# Patient Record
Sex: Female | Born: 1975 | Race: White | Hispanic: No | Marital: Single | Smoking: Former smoker
Health system: Southern US, Community
[De-identification: ages and names within clinical notes are randomized; demographics above are authoritative.]

## PROBLEM LIST (undated history)

## (undated) DIAGNOSIS — G8929 Other chronic pain: Secondary | ICD-10-CM

## (undated) DIAGNOSIS — M5136 Other intervertebral disc degeneration, lumbar region: Secondary | ICD-10-CM

## (undated) DIAGNOSIS — F319 Bipolar disorder, unspecified: Secondary | ICD-10-CM

## (undated) DIAGNOSIS — M549 Dorsalgia, unspecified: Secondary | ICD-10-CM

## (undated) DIAGNOSIS — M797 Fibromyalgia: Secondary | ICD-10-CM

## (undated) DIAGNOSIS — F603 Borderline personality disorder: Secondary | ICD-10-CM

## (undated) DIAGNOSIS — F410 Panic disorder [episodic paroxysmal anxiety] without agoraphobia: Secondary | ICD-10-CM

## (undated) DIAGNOSIS — R45851 Suicidal ideations: Secondary | ICD-10-CM

## (undated) DIAGNOSIS — R569 Unspecified convulsions: Secondary | ICD-10-CM

## (undated) DIAGNOSIS — M51369 Other intervertebral disc degeneration, lumbar region without mention of lumbar back pain or lower extremity pain: Secondary | ICD-10-CM

## (undated) DIAGNOSIS — M25519 Pain in unspecified shoulder: Secondary | ICD-10-CM

## (undated) HISTORY — DX: Bipolar disorder, unspecified: F31.9

## (undated) HISTORY — DX: Borderline personality disorder: F60.3

## (undated) HISTORY — PX: HERNIA REPAIR: SHX51

---

## 1997-07-19 ENCOUNTER — Encounter (HOSPITAL_COMMUNITY): Admission: RE | Admit: 1997-07-19 | Discharge: 1997-08-30 | Payer: Self-pay | Admitting: Obstetrics

## 1999-09-28 ENCOUNTER — Encounter: Admission: RE | Admit: 1999-09-28 | Discharge: 1999-09-28 | Payer: Self-pay | Admitting: Obstetrics

## 2001-07-03 ENCOUNTER — Encounter: Payer: Self-pay | Admitting: Internal Medicine

## 2001-07-03 ENCOUNTER — Ambulatory Visit (HOSPITAL_COMMUNITY): Admission: RE | Admit: 2001-07-03 | Discharge: 2001-07-03 | Payer: Self-pay | Admitting: Internal Medicine

## 2001-09-25 ENCOUNTER — Encounter: Payer: Self-pay | Admitting: *Deleted

## 2001-09-25 ENCOUNTER — Emergency Department (HOSPITAL_COMMUNITY): Admission: EM | Admit: 2001-09-25 | Discharge: 2001-09-25 | Payer: Self-pay | Admitting: *Deleted

## 2001-10-10 ENCOUNTER — Inpatient Hospital Stay (HOSPITAL_COMMUNITY): Admission: EM | Admit: 2001-10-10 | Discharge: 2001-10-11 | Payer: Self-pay | Admitting: *Deleted

## 2002-01-17 ENCOUNTER — Emergency Department (HOSPITAL_COMMUNITY): Admission: EM | Admit: 2002-01-17 | Discharge: 2002-01-17 | Payer: Self-pay | Admitting: Emergency Medicine

## 2002-01-17 ENCOUNTER — Encounter: Payer: Self-pay | Admitting: Emergency Medicine

## 2003-03-13 ENCOUNTER — Emergency Department (HOSPITAL_COMMUNITY): Admission: EM | Admit: 2003-03-13 | Discharge: 2003-03-13 | Payer: Self-pay | Admitting: Emergency Medicine

## 2003-09-25 ENCOUNTER — Ambulatory Visit (HOSPITAL_COMMUNITY): Admission: RE | Admit: 2003-09-25 | Discharge: 2003-09-25 | Payer: Self-pay | Admitting: Neurology

## 2003-09-27 ENCOUNTER — Emergency Department (HOSPITAL_COMMUNITY): Admission: EM | Admit: 2003-09-27 | Discharge: 2003-09-27 | Payer: Self-pay | Admitting: *Deleted

## 2003-12-01 ENCOUNTER — Ambulatory Visit (HOSPITAL_COMMUNITY): Admission: RE | Admit: 2003-12-01 | Discharge: 2003-12-01 | Payer: Self-pay | Admitting: *Deleted

## 2003-12-28 ENCOUNTER — Emergency Department (HOSPITAL_COMMUNITY): Admission: EM | Admit: 2003-12-28 | Discharge: 2003-12-28 | Payer: Self-pay | Admitting: Emergency Medicine

## 2004-03-25 ENCOUNTER — Emergency Department (HOSPITAL_COMMUNITY): Admission: EM | Admit: 2004-03-25 | Discharge: 2004-03-25 | Payer: Self-pay | Admitting: Emergency Medicine

## 2004-06-24 ENCOUNTER — Emergency Department (HOSPITAL_COMMUNITY): Admission: EM | Admit: 2004-06-24 | Discharge: 2004-06-24 | Payer: Self-pay | Admitting: Emergency Medicine

## 2005-08-06 ENCOUNTER — Emergency Department (HOSPITAL_COMMUNITY): Admission: EM | Admit: 2005-08-06 | Discharge: 2005-08-06 | Payer: Self-pay | Admitting: Emergency Medicine

## 2005-09-29 ENCOUNTER — Emergency Department (HOSPITAL_COMMUNITY): Admission: EM | Admit: 2005-09-29 | Discharge: 2005-09-29 | Payer: Self-pay | Admitting: Emergency Medicine

## 2005-10-31 ENCOUNTER — Emergency Department (HOSPITAL_COMMUNITY): Admission: EM | Admit: 2005-10-31 | Discharge: 2005-10-31 | Payer: Self-pay | Admitting: Emergency Medicine

## 2007-09-28 ENCOUNTER — Inpatient Hospital Stay (HOSPITAL_COMMUNITY): Admission: EM | Admit: 2007-09-28 | Discharge: 2007-09-29 | Payer: Self-pay | Admitting: Emergency Medicine

## 2008-02-19 ENCOUNTER — Emergency Department (HOSPITAL_COMMUNITY): Admission: EM | Admit: 2008-02-19 | Discharge: 2008-02-19 | Payer: Self-pay | Admitting: Emergency Medicine

## 2008-03-29 ENCOUNTER — Ambulatory Visit (HOSPITAL_COMMUNITY): Admission: RE | Admit: 2008-03-29 | Discharge: 2008-03-29 | Payer: Self-pay | Admitting: Family Medicine

## 2008-12-21 ENCOUNTER — Ambulatory Visit (HOSPITAL_COMMUNITY): Admission: RE | Admit: 2008-12-21 | Discharge: 2008-12-21 | Payer: Self-pay | Admitting: Family Medicine

## 2009-10-28 ENCOUNTER — Emergency Department (HOSPITAL_COMMUNITY): Admission: EM | Admit: 2009-10-28 | Discharge: 2009-10-29 | Payer: Self-pay | Admitting: Emergency Medicine

## 2009-12-24 ENCOUNTER — Emergency Department (HOSPITAL_COMMUNITY): Admission: EM | Admit: 2009-12-24 | Discharge: 2009-12-24 | Payer: Self-pay | Admitting: Emergency Medicine

## 2010-09-03 LAB — CULTURE, ROUTINE-ABSCESS

## 2010-09-05 LAB — URINALYSIS, ROUTINE W REFLEX MICROSCOPIC
Bilirubin Urine: NEGATIVE
Glucose, UA: NEGATIVE mg/dL
Hgb urine dipstick: NEGATIVE
Protein, ur: NEGATIVE mg/dL

## 2010-09-05 LAB — GLUCOSE, CAPILLARY: Glucose-Capillary: 101 mg/dL — ABNORMAL HIGH (ref 70–99)

## 2010-10-04 ENCOUNTER — Other Ambulatory Visit (HOSPITAL_COMMUNITY): Payer: Self-pay | Admitting: Family Medicine

## 2010-10-04 DIAGNOSIS — N63 Unspecified lump in unspecified breast: Secondary | ICD-10-CM

## 2010-10-11 ENCOUNTER — Ambulatory Visit (HOSPITAL_COMMUNITY): Payer: Medicaid Other
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
## 2010-11-03 NOTE — H&P (Signed)
NAME:  [name], [name]                             ACCOUNT NO.:  [id number]   MEDICAL RECORD NO.:  [id number]                  PATIENT TYPE:   LOCATION:                                       FACILITY:   PHYSICIAN:  Langley Gauss, M.D.                DATE OF BIRTH:   DATE OF ADMISSION:  DATE OF DISCHARGE:                                HISTORY & PHYSICAL   ANTICIPATED DATE OF ADMISSION:  Not yet determined.   The patient is a 35 year old, gravida 2, para 1, with an unreliable last  menstrual period who comes in the office today on November 25, 2003 for a planned  repeat ultrasound.  The patient is noted to be an insulin-dependent  diabetic.  She has had some pelvic cramping, but no complaints of any  vaginal bleeding.  She has good breast tenderness.  She has had nausea.  Her  blood sugars are somewhat improved, in the range of 200 to 250.  She is  currently planning on getting an insulin pump.  She does well managing her  own blood sugars at home, making daily adjustments.  Repeat transvaginal  ultrasound performed today as compared to previous ultrasound dated November 19, 2003 shows there is a large gestational sac identified within the uterus  with a maximum diameter of 1.67 cm seated within a well defined delineated  yolk sac with a maximum diameter of 0.87 cm.  However, a fetal pole is not  well seen.  What appears to be a fetal pole is measured at five weeks and  six days.  However, no fetal cardiac activity is seen in this portion of the  scan.  Pulsations are seen on the uterine wall.  It is uncertain whether  these represent artifact or very early signs of cardiac activity, which are  not measurable by the fetal Doppler and the ultrasound machine.   I discussed the findings with the patient.  In the absence of any  significant vaginal bleeding or cramping and continued change in the  ultrasound there is no indication for any intervention at present.  However,  clinically this appears  to be a very slowly developing pregnancy and thus  fetal cardiac activity should be seen very shortly if this is to be  considered a normal pregnancy.     ___________________________________________                                         Langley Gauss, M.D.   DC/MEDQ  D:  11/25/2003  T:  11/25/2003  Job:  [id number]

## 2010-11-03 NOTE — Op Note (Signed)
NAME:  [name], [name]                           ACCOUNT NO.:  [id number]   MEDICAL RECORD NO.:  [id number]                   PATIENT TYPE:  AMB   LOCATION:  DAY                                  FACILITY:  [facility]   PHYSICIAN:  Langley Gauss, M.D.                DATE OF BIRTH:  10/27/1975   DATE OF PROCEDURE:  12/01/2003  DATE OF DISCHARGE:                                 OPERATIVE REPORT   PREOPERATIVE DIAGNOSES:  1. Missed abortion, [redacted] weeks gestation.  2. Insulin-dependent diabetes mellitus preceding the pregnancy, onset age 58.   PROCEDURE PERFORMED:  Dilation and curettage, #8 suction catheter tip  utilized.   SURGEON:  Dr. Roylene Reason. Lisette Grinder   ANESTHESIA:  General endotracheal.   SPECIMENS:  Obvious products of conception to pathology for permanent  sections only.   FINDINGS:  An 8 week size uterus, obvious products of conception obtained  for specimen.  Intrauterine cavity noted to be normal in contour.   SUMMARY:  Vital signs stable.  The patient did have apprehensions regarding  proceeding with the planned surgical procedure.  When I discussed again, as  24 hours previously, the sonographic findings with the findings consistent  with missed abortion, the patient was prepared to proceed.  She was taken to  the operating room.  Vital signs stable.  Underwent induction of general  anesthesia, placed in dorsal lithotomy position, prepped and draped in the  sterile manner.  Sterile speculum examination visualized the cervix.  Mucoid  discharge identified, chronic cervicitis or active cervicitis identified.  Sounded to a depth of 8 cm, anteflexed uterus, then dilated to a size #25  dilator which easily allows passage of a #8 suction catheter tip up to the  uterine fundus.  Gentle suction is then applied.  Superior withdrawing  motion resulted in suctioning the entire uterine cavity.  Fine banjo  curettage performed with no further products of conception obtained.  Additional  suction curettage obtained.  No additional tissue obtained.  Minimal bleeding noted to ensue.  Bimanual massage reveals 8 week size  uterus, firm in consistency.  The patient reversed of anesthesia and taken  to the recovery room in stable condition.   Planned day of discharge is December 01, 2003, given standard discharge  instructions, and follow up in the office in 1 week's time.  The patient  will continue monitoring and adjusting her insulin as clinically indicated  as she does on her own at home.   PERTINENT LABORATORY STUDIES:  Hemoglobin 12.0, hematocrit 34.9, white count  6.4.  Fasting glucose on arrival 79.  Type and screen clearly ordered upon  patient's presentation to surgical unit at 10 a.m., now at 1345.  This  result is not in the chart.  She will, of course, receive a RhoGAM work-up  as needed if indicated.  If the patient does well postoperatively, she will  be discharged home  today.  The patient did receive 600 mg of IV Cleocin  preoperatively, as she states she has a PENICILLIN allergy which results in  throat swelling.  She did well with the Cleocin and discharge medications to  include p.o. doxycycline.  The patient has received prescription for Lortab  24 hours previously.      ___________________________________________                                            Langley Gauss, M.D.   DC/MEDQ  D:  12/01/2003  T:  12/01/2003  Job:  [id number]

## 2010-11-03 NOTE — Discharge Summary (Signed)
[place]  Patient:    [name], [name] Visit Number: [id number] MRN: [id number]          Service Type: MED Location: [id number] Attending Physician:  Alfonso Ramus Dictated by:   Harrold Donath, M.D. Admit Date:  10/10/2001 Discharge Date: 10/11/2001   CC:         Carylon Perches, M.D.,    Discharge Summary  DISCHARGE DIAGNOSIS:  Diabetes mellitus, type 1, with hypoglycemia.  DISCHARGE MEDICATIONS: 1. Humalog 12 units after meals. 2. Lantus 18 units subcu q.h.s.  HISTORY OF PRESENT ILLNESS:  Patient is a 35 year old with diabetes mellitus, type 1, who presented with hypoglycemia of 18.  She takes 15 units of Humalog with meals and 25 units of Lantus at bedtime.  She presented feeling tremulous, anxious, and nauseated, and was noted to have a CBG of 18.  HOSPITAL COURSE:  Diabetes mellitus, type 1.  The patient responded well to two amps of D50.  This brought her CBG up to 102.  She was given IV fluids with D5 and was monitored.  She was given diabetic teaching and her doses were changed to help prevent hypoglycemia.  The patient did well overnight and was taking p.o. by the day of discharge without any further nausea.  CONDITION ON DISCHARGE:  Patient was discharged to home in stable condition.  INSTRUCTIONS:  Patient was told of her lower doses and also to call her primary care physician, Dr. Ouida Sills, for an appointment in one to two weeks. Dictated by:   Harrold Donath, M.D. Attending Physician:  Alfonso Ramus DD:  10/31/01 TD:  11/04/01 Job: [id number]

## 2010-12-27 ENCOUNTER — Ambulatory Visit (HOSPITAL_COMMUNITY)
Admission: RE | Admit: 2010-12-27 | Discharge: 2010-12-27 | Disposition: A | Discharge status: Home / Self Care | Payer: Medicaid Other | Source: Ambulatory Visit | Attending: Family Medicine | Admitting: Family Medicine

## 2010-12-27 ENCOUNTER — Other Ambulatory Visit (HOSPITAL_COMMUNITY): Payer: Self-pay | Admitting: Family Medicine

## 2010-12-27 DIAGNOSIS — N63 Unspecified lump in unspecified breast: Secondary | ICD-10-CM

## 2011-01-03 ENCOUNTER — Encounter (HOSPITAL_COMMUNITY): Payer: Medicaid Other

## 2011-03-13 LAB — DIFFERENTIAL
Basophils Absolute: 0
Basophils Relative: 1
Eosinophils Relative: 2
Monocytes Absolute: 0.6
Neutro Abs: 5.5

## 2011-03-13 LAB — BASIC METABOLIC PANEL
CO2: 24
Calcium: 9.3
Glucose, Bld: 372 — ABNORMAL HIGH
Sodium: 135

## 2011-03-13 LAB — RAPID URINE DRUG SCREEN, HOSP PERFORMED
Amphetamines: NOT DETECTED
Barbiturates: NOT DETECTED
Benzodiazepines: POSITIVE — AB
Cocaine: NOT DETECTED
Opiates: POSITIVE — AB

## 2011-03-13 LAB — CBC
Hemoglobin: 13.6
MCHC: 35.1
RDW: 13

## 2011-03-13 LAB — GLUCOSE, RANDOM: Glucose, Bld: 393 — ABNORMAL HIGH

## 2011-03-21 LAB — POCT I-STAT, CHEM 8
BUN: 8
Chloride: 103
HCT: 40
Sodium: 140
TCO2: 27

## 2011-03-21 LAB — CBC
Hemoglobin: 13.3
MCHC: 34.6
MCV: 95.7
RBC: 4.01

## 2011-03-21 LAB — DIFFERENTIAL
Basophils Relative: 1
Eosinophils Absolute: 0.4
Monocytes Absolute: 0.4
Monocytes Relative: 6

## 2011-03-21 LAB — URINALYSIS, ROUTINE W REFLEX MICROSCOPIC
Bilirubin Urine: NEGATIVE
Glucose, UA: NEGATIVE
Hgb urine dipstick: NEGATIVE
Specific Gravity, Urine: <1.005 — ABNORMAL LOW
pH: 6

## 2011-03-21 LAB — WET PREP, GENITAL: Clue Cells Wet Prep HPF POC: NONE SEEN

## 2011-04-11 ENCOUNTER — Emergency Department (HOSPITAL_COMMUNITY)
Admission: EM | Admit: 2011-04-11 | Discharge: 2011-04-11 | Disposition: A | Discharge status: Other Acute Inpatient Hospital | Payer: Medicaid Other | Source: Home / Self Care | Attending: Emergency Medicine | Admitting: Emergency Medicine

## 2011-04-11 ENCOUNTER — Encounter: Payer: Self-pay | Admitting: Emergency Medicine

## 2011-04-11 ENCOUNTER — Inpatient Hospital Stay (HOSPITAL_COMMUNITY)
Admission: AD | Admit: 2011-04-11 | Discharge: 2011-04-13 | DRG: 885 | Disposition: A | Discharge status: Home / Self Care | Payer: Medicaid Other | Source: Ambulatory Visit | Attending: Psychiatry | Admitting: Psychiatry

## 2011-04-11 DIAGNOSIS — Z79899 Other long term (current) drug therapy: Secondary | ICD-10-CM

## 2011-04-11 DIAGNOSIS — Z880 Allergy status to penicillin: Secondary | ICD-10-CM

## 2011-04-11 DIAGNOSIS — F319 Bipolar disorder, unspecified: Secondary | ICD-10-CM

## 2011-04-11 DIAGNOSIS — F411 Generalized anxiety disorder: Secondary | ICD-10-CM

## 2011-04-11 DIAGNOSIS — Z6379 Other stressful life events affecting family and household: Secondary | ICD-10-CM

## 2011-04-11 DIAGNOSIS — R45851 Suicidal ideations: Secondary | ICD-10-CM

## 2011-04-11 DIAGNOSIS — Z794 Long term (current) use of insulin: Secondary | ICD-10-CM

## 2011-04-11 DIAGNOSIS — F332 Major depressive disorder, recurrent severe without psychotic features: Principal | ICD-10-CM

## 2011-04-11 DIAGNOSIS — M549 Dorsalgia, unspecified: Secondary | ICD-10-CM

## 2011-04-11 DIAGNOSIS — F410 Panic disorder [episodic paroxysmal anxiety] without agoraphobia: Secondary | ICD-10-CM

## 2011-04-11 DIAGNOSIS — G8929 Other chronic pain: Secondary | ICD-10-CM

## 2011-04-11 DIAGNOSIS — E109 Type 1 diabetes mellitus without complications: Secondary | ICD-10-CM

## 2011-04-11 HISTORY — DX: Suicidal ideations: R45.851

## 2011-04-11 HISTORY — DX: Panic disorder (episodic paroxysmal anxiety): F41.0

## 2011-04-11 LAB — GLUCOSE, CAPILLARY
Glucose-Capillary: 107 mg/dL — ABNORMAL HIGH (ref 70–99)
Glucose-Capillary: 207 mg/dL — ABNORMAL HIGH (ref 70–99)
Glucose-Capillary: 244 mg/dL — ABNORMAL HIGH (ref 70–99)

## 2011-04-11 LAB — BASIC METABOLIC PANEL
BUN: 8 mg/dL (ref 6–23)
Chloride: 103 mEq/L (ref 96–112)
GFR calc Af Amer: >90 mL/min (ref >90)
GFR calc non Af Amer: 88 mL/min — ABNORMAL LOW (ref >90)
Potassium: 3.9 mEq/L (ref 3.5–5.1)
Sodium: 140 mEq/L (ref 135–145)

## 2011-04-11 LAB — CBC
HCT: 42.7 % (ref 36.0–46.0)
MCH: 32.1 pg (ref 26.0–34.0)
MCV: 94.5 fL (ref 78.0–100.0)
Platelets: 247 10*3/uL (ref 150–400)
RDW: 12.3 % (ref 11.5–15.5)
WBC: 8.1 10*3/uL (ref 4.0–10.5)

## 2011-04-11 LAB — DIFFERENTIAL
Basophils Absolute: 0 10*3/uL (ref 0.0–0.1)
Basophils Relative: 0 % (ref 0–1)
Monocytes Relative: 7 % (ref 3–12)
Neutro Abs: 4.2 10*3/uL (ref 1.7–7.7)
Neutrophils Relative %: 52 % (ref 43–77)

## 2011-04-11 LAB — RAPID URINE DRUG SCREEN, HOSP PERFORMED
Benzodiazepines: POSITIVE — AB
Cocaine: NOT DETECTED
Opiates: NOT DETECTED

## 2011-04-11 LAB — PREGNANCY, URINE: Preg Test, Ur: NEGATIVE

## 2011-04-11 MED ORDER — NICOTINE 21 MG/24HR TD PT24
21.0000 mg | MEDICATED_PATCH | Freq: Once | TRANSDERMAL | Status: DC
  Administered 2011-04-11: 21 mg via TRANSDERMAL
  Filled 2011-04-11: qty 1

## 2011-04-11 MED ORDER — ONDANSETRON HCL 4 MG PO TABS: 4.0000 mg | ORAL_TABLET | Freq: Three times a day (TID) | ORAL | Status: DC | PRN

## 2011-04-11 MED ORDER — LORAZEPAM 1 MG PO TABS
1.0000 mg | ORAL_TABLET | Freq: Three times a day (TID) | ORAL | Status: DC | PRN
  Administered 2011-04-11: 1 mg via ORAL
  Filled 2011-04-11: qty 1

## 2011-04-11 NOTE — ED Notes (Signed)
Report given to [facility], advised they would be here in approximately 30 minutes.

## 2011-04-11 NOTE — ED Notes (Signed)
Pt alert and oriented. Pt given 8 oz juice for cbg-45.

## 2011-04-11 NOTE — ED Notes (Addendum)
Pt states she thinks she is having a panic attack. States worried about father. States she has felt SI's all week. Today thought about taking a bottle of xanax this am. States she can not be comitted anywhere b/c she has to take care of her daughter. Pt cooperative, tearful at this time. Denies HI. Pt also c/o chronic back pain at present rating 7

## 2011-04-11 NOTE — ED Notes (Signed)
Pt ambulated to restroom. 

## 2011-04-11 NOTE — ED Notes (Signed)
Pt BS 207, advised pt we would check again in an hour to prevent her from"bottoming out"

## 2011-04-11 NOTE — ED Notes (Signed)
[facility] in to transfer pt.
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
## 2011-04-11 NOTE — ED Notes (Signed)
Physician aware of pt BS 207

## 2011-04-11 NOTE — ED Notes (Signed)
Pt and family request to speak with edp. edp notified and states he will be in when able-pt and family notifed. nad noted.

## 2011-04-12 ENCOUNTER — Emergency Department (HOSPITAL_COMMUNITY): Admission: EM | Admit: 2011-04-12 | Payer: Medicaid Other | Source: Home / Self Care

## 2011-04-12 ENCOUNTER — Ambulatory Visit (HOSPITAL_COMMUNITY)
Admission: EM | Admit: 2011-04-12 | Discharge: 2011-04-12 | Disposition: A | Discharge status: Home / Self Care | Payer: Medicaid Other | Source: Other Acute Inpatient Hospital | Attending: Psychiatry | Admitting: Psychiatry

## 2011-04-12 DIAGNOSIS — F339 Major depressive disorder, recurrent, unspecified: Secondary | ICD-10-CM

## 2011-04-12 DIAGNOSIS — F4001 Agoraphobia with panic disorder: Secondary | ICD-10-CM

## 2011-04-12 DIAGNOSIS — F411 Generalized anxiety disorder: Secondary | ICD-10-CM

## 2011-04-12 LAB — GLUCOSE, CAPILLARY
Glucose-Capillary: 272 mg/dL — ABNORMAL HIGH (ref 70–99)
Glucose-Capillary: 121 mg/dL — ABNORMAL HIGH (ref 70–99)
Glucose-Capillary: 294 mg/dL — ABNORMAL HIGH (ref 70–99)
Glucose-Capillary: 590 mg/dL (ref 70–99)

## 2011-04-12 LAB — PREGNANCY, URINE: Preg Test, Ur: NEGATIVE

## 2011-04-13 LAB — HEMOGLOBIN A1C: Hgb A1c MFr Bld: 7.2 % — ABNORMAL HIGH (ref <5.7)

## 2011-04-13 LAB — DIFFERENTIAL
Basophils Relative: 0 % (ref 0–1)
Eosinophils Absolute: 0.1 10*3/uL (ref 0.0–0.7)
Lymphs Abs: 1.9 10*3/uL (ref 0.7–4.0)
Neutro Abs: 4.5 10*3/uL (ref 1.7–7.7)
Neutrophils Relative %: 64 % (ref 43–77)

## 2011-04-13 LAB — LIPID PANEL
HDL: 39 mg/dL — ABNORMAL LOW (ref >39)
LDL Cholesterol: 183 mg/dL — ABNORMAL HIGH (ref 0–99)
VLDL: 16 mg/dL (ref 0–40)

## 2011-04-13 LAB — URINALYSIS, ROUTINE W REFLEX MICROSCOPIC
Ketones, ur: 15 mg/dL — AB
Leukocytes, UA: NEGATIVE
Nitrite: NEGATIVE
Protein, ur: NEGATIVE mg/dL
Urobilinogen, UA: 0.2 mg/dL (ref 0.0–1.0)
pH: 5.5 (ref 5.0–8.0)

## 2011-04-13 LAB — COMPREHENSIVE METABOLIC PANEL
AST: 12 U/L (ref 0–37)
Albumin: 3.5 g/dL (ref 3.5–5.2)
Alkaline Phosphatase: 111 U/L (ref 39–117)
Alkaline Phosphatase: 92 U/L (ref 39–117)
BUN: 10 mg/dL (ref 6–23)
Calcium: 8.6 mg/dL (ref 8.4–10.5)
Chloride: 95 mEq/L — ABNORMAL LOW (ref 96–112)
GFR calc Af Amer: >90 mL/min (ref >90)
Glucose, Bld: 58 mg/dL — ABNORMAL LOW (ref 70–99)
Potassium: 3.6 mEq/L (ref 3.5–5.1)
Potassium: 4.2 mEq/L (ref 3.5–5.1)
Sodium: 130 mEq/L — ABNORMAL LOW (ref 135–145)
Total Bilirubin: 0.5 mg/dL (ref 0.3–1.2)
Total Protein: 6.1 g/dL (ref 6.0–8.3)

## 2011-04-13 LAB — BLOOD GAS, VENOUS
FIO2: 0.21 %
pCO2, Ven: 39.9 mmHg — ABNORMAL LOW (ref 45.0–50.0)
pO2, Ven: 65.9 mmHg — ABNORMAL HIGH (ref 30.0–45.0)

## 2011-04-13 LAB — GLUCOSE, CAPILLARY
Glucose-Capillary: 71 mg/dL (ref 70–99)
Glucose-Capillary: 174 mg/dL — ABNORMAL HIGH (ref 70–99)

## 2011-04-13 LAB — CBC
Platelets: 215 10*3/uL (ref 150–400)
RBC: 4.18 MIL/uL (ref 3.87–5.11)
WBC: 6.9 10*3/uL (ref 4.0–10.5)

## 2011-04-13 LAB — URINE MICROSCOPIC-ADD ON
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
## 2011-07-20 ENCOUNTER — Encounter (HOSPITAL_COMMUNITY): Payer: Self-pay | Admitting: Pharmacy Technician

## 2011-07-23 ENCOUNTER — Other Ambulatory Visit: Payer: Self-pay | Admitting: Obstetrics and Gynecology

## 2011-07-25 ENCOUNTER — Encounter (HOSPITAL_COMMUNITY)
Admission: RE | Admit: 2011-07-25 | Discharge: 2011-07-25 | Disposition: A | Discharge status: Home / Self Care | Payer: Medicaid Other | Source: Ambulatory Visit | Attending: Obstetrics and Gynecology | Admitting: Obstetrics and Gynecology

## 2011-07-25 ENCOUNTER — Encounter (HOSPITAL_COMMUNITY): Payer: Self-pay

## 2011-07-25 ENCOUNTER — Other Ambulatory Visit: Payer: Self-pay

## 2011-07-25 HISTORY — DX: Unspecified convulsions: R56.9

## 2011-07-25 HISTORY — DX: Fibromyalgia: M79.7

## 2011-07-25 LAB — CBC
HCT: 36.3 % (ref 36.0–46.0)
Hemoglobin: 12.2 g/dL (ref 12.0–15.0)
WBC: 5.8 10*3/uL (ref 4.0–10.5)

## 2011-07-25 LAB — BASIC METABOLIC PANEL
Chloride: 106 mEq/L (ref 96–112)
GFR calc Af Amer: >90 mL/min (ref >90)
Potassium: 4.3 mEq/L (ref 3.5–5.1)

## 2011-07-25 NOTE — Patient Instructions (Addendum)
20 [name]  07/25/2011   Your procedure is scheduled on:  07/31/11  Report to [facility] at 06:15 AM.  Call this number if you have problems the morning of surgery: [phone]   Remember:   Do not eat food:After Midnight.  May have clear liquids:until Midnight .  Clear liquids include soda, tea, black coffee, apple or grape juice, broth.  Take these medicines the morning of surgery with A SIP OF WATER: Abilify, Adderall, Neurotin and Effexor. Take your Klonopin and Norco only if needed. The night before your surgery, only take half your dose of Lantus=25 units instead of 50 units and do not take any of your Novolog the morning of surgery.   Do not wear jewelry, make-up or nail polish.  Do not wear lotions, powders, or perfumes. You may wear deodorant.  Do not shave 48 hours prior to surgery.  Do not bring valuables to the hospital.  Contacts, dentures or bridgework may not be worn into surgery.  Leave suitcase in the car. After surgery it may be brought to your room.  For patients admitted to the hospital, checkout time is 11:00 AM the day of discharge.   Patients discharged the day of surgery will not be allowed to drive home.  Name and phone number of your driver:   Special Instructions: CHG Shower Use Special Wash: 1/2 bottle night before surgery and 1/2 bottle morning of surgery.   Please read over the following fact sheets that you were given: Pain Booklet, MRSA Information, Surgical Site Infection Prevention, Anesthesia Post-op Instructions and Care and Recovery After Surgery    Laparoscopic Tubal Ligation Laparoscopic tubal ligation is a procedure that closes the fallopian tubes. Tubal ligation is also known as getting your "tubes tied." It is a brief, common and relatively simple surgical procedure. Tubal ligation is done to cause sterilization. Sterilization means you will not be able to get pregnant or have a baby. Sometimes a tubal ligation may be reversed, but this should  not be considered a possibility because of failure to get pregnant and the increased risk of tubal (ectopic) pregnancy. If you want to have future pregnancies, you should not have this procedure. Use other forms of contraception. Tubal ligation can be done at any time during your menstrual cycle. This procedure has a less than 1% failure rate. Tubal ligation does not protect against sexually transmitted disease. LET YOUR CAREGIVER KNOW ABOUT:  Allergies, especially to medicines.   All the medicines you are taking, even herbs, eyedrops, steroids, creams, and over-the-counter drugs.   The possibility of being pregnant.   Past problems with medicines.   History of blood clots or other blood problems.   Past surgeries, medical or health problems.  RISKS AND COMPLICATIONS  Your caregiver will discuss the risks with you before the procedure. Some problems that can happen after this procedure include:  Infection. A germ starts growing in one of the wounds. This can usually be treated with antibiotic medicines.   Bleeding following surgery. Your surgeon takes every precaution to keep this from happening.   Damage to other organs. If damage to other organs or excessive bleeding should occur, it may be necessary to convert the laparoscopic procedure into an open abdominal procedure. Scarring (adhesions) from past surgeries or disease may also be a cause to change this procedure to an open abdominal operation.   Anesthetic side effects.  BEFORE THE PROCEDURE  Do not take aspirin or blood thinners a week before the procedure.  This can cause bleeding. Do as directed by your caregiver.   Do not eat or drink anything 6 to 8 hours before the procedure.   Let your caregiver know if you get a cold or an infection before the procedure.   Arrive at the clinic or hospital 60 minutes before the surgery or as directed.  PROCEDURE   You may be given a mild drug (sedative) to help you relax before the  procedure. Once in the operating room, you will be given a general anesthetic to make you sleep (unless you and your caregiver choose a different anesthetic).   Once you are sleeping, your surgeon makes your abdomen larger with a harmless gas (carbon dioxide). This makes your organs easier to see and gives room to operate.   A laparoscope is then inserted into your abdomen through a small cut (incision) below the navel. A laparoscope is a thin, lighted, pencil-sized instrument. It is like a telescope. Once inserted, your caregiver can look at the organs inside your abdomen. He or she can see if there is anything abnormal.   Other small instruments also are put into the abdomen through other small openings (portals). Many surgeons attach a video camera to the laparoscope to enlarge the view. The fallopian tubes are located and either burned closed (cauterized) or a plastic clip is placed on them to close the tubes.  Sometimes, your surgeon may take tissue samples (biopsies) from other organs if he or she sees something abnormal. Biopsies can help to diagnose or confirm a disease. The samples are examined under a microscope. AFTER THE PROCEDURE   The gas is released from inside your abdomen.   Your incisions are closed with stitches (sutures). Because these incisions are small (usually less than  inch), there is usually little discomfort following the procedure.   You will rest in a recovery room until you are stable and doing well.   As long as there are no problems, you may be allowed to go home.   Someone will need to drive you home.   You will have some mild discomfort in the throat. This is from the tube placed in your throat while you were sleeping.   You may experience discomfort in the shoulder area from some trapped air between the liver and diaphragm. This will slowly go away on its own.   You will also have some mild abdominal discomfort. You will also have discomfort from the  incisions where the instruments were placed into your abdomen.  HOME CARE INSTRUCTIONS   Only take over-the-counter or prescription medicines for pain, discomfort, or fever as directed by your caregiver. Do not take aspirin. It can cause bleeding.   Resume daily activities, diet, rest, driving, and work as directed.   Showers are preferred over baths.   Resume sexual activities in 1 week or as directed.   If biopsies were taken, find out how to get your results. The results are usually given during the follow-up visit. Do not assume tests are normal if you do not hear from your caregiver. It is your responsibility to obtain your results.   Change the dressings as directed.   It is helpful to have someone with you for several hours after you go home. They can help you and be available if you have problems.  SEEK MEDICAL CARE IF:   You have increasing abdominal pain.   You develop new pain in your shoulders in the shoulder strap area that gets worse with  time. Some pain is common and expected.   You feel lightheaded or faint.   You have chills or fever.   You develop bleeding or drainage from the suture sites or the vagina after surgery.   You develop chest pain.   You develop shortness of breath.   You develop a rash.  Document Released: 09/10/2000 Document Revised: 02/14/2011 Document Reviewed: 12/24/2007 [facility] Patient Information 2012 [place], [place].   PATIENT INSTRUCTIONS POST-ANESTHESIA  IMMEDIATELY FOLLOWING SURGERY:  Do not drive or operate machinery for the first twenty four hours after surgery.  Do not make any important decisions for twenty four hours after surgery or while taking narcotic pain medications or sedatives.  If you develop intractable nausea and vomiting or a severe headache please notify your doctor immediately.  FOLLOW-UP:  Please make an appointment with your surgeon as instructed. You do not need to follow up with anesthesia unless specifically  instructed to do so.  WOUND CARE INSTRUCTIONS (if applicable):  Keep a dry clean dressing on the anesthesia/puncture wound site if there is drainage.  Once the wound has quit draining you may leave it open to air.  Generally you should leave the bandage intact for twenty four hours unless there is drainage.  If the epidural site drains for more than 36-48 hours please call the anesthesia department.  QUESTIONS?:  Please feel free to call your physician or the hospital operator if you have any questions, and they will be happy to assist you.     [facility] Anesthesia Department [address] [place] [phone]

## 2011-07-25 NOTE — H&P (Addendum)
[name] is an 36 y.o. female. She is admitted for laparoscopic tubal sterilization by Falope ring application she has signed Medicaid sterilization forms 06/27/2011 firm her desire for permanent sterilization she is a type I diabetic and considers future pregnancy health risks to herself additional children her current partner is status post vasectomy but there is an apparent failure of his vaginectomy. Current contraception is never ring Medical history positive for mental illness and diabetes type 1 [facility] history[facility]  Pertinent Gynecological History: Menses: flow is light when on oral contraceptives described as heavy when on no contraception Bleeding: None except menses Contraception: NuvaRing vaginal inserts DES exposure: denies Blood transfusions: none Sexually transmitted diseases: no past history Previous GYN Procedures: None other than cesarean section 1998  Last mammogram: Not applicable Date:  Last pap:  Date:  OB History: G 1, P 1   Menstrual History: Menarche age:  No LMP recorded.06/24/2011    Past Medical History  Diagnosis Date  . Panic attacks   . Diabetes mellitus   . Suicidal thoughts     Past Surgical History  Procedure Date  . Cesarean section     No family history on file.  Social History:  reports that she has been smoking.  She does not have any smokeless tobacco history on file. She reports that she drinks alcohol. Her drug history not on file.  Allergies:  Allergies  Allergen Reactions  . Penicillins Shortness Of Breath and Other (See Comments)    Unknown Childhood reaction  . Levaquin Swelling    No prescriptions prior to admission    ROS Every menses when not on oral contraceptives There were no vitals taken for this visit. Physical ExamPhysical Examination: Mental status - alert, oriented to person, place, and time, normal mood, behavior, speech, dress, motor activity, and thought  processes Mouth - mucous membranes moist, pharynx normal without lesions, dental hygiene good and Airway accessible Chest - clear to auscultation, no wheezes, rales or rhonchi, symmetric air entry Heart - normal rate and regular rhythm Abdomen - soft, nontender, nondistended, no masses or organomegaly scars from previous incisions Pfannenstiel well healed Pelvic - normal external genitalia, vulva, vagina, cervix, uterus and adnexa, exam chaperoned by office nurse Extremities - Homan's sign negative bilaterally Skin -    No results found for this or any previous visit (from the past 24 hour(s)).  No results found.  Assessment/Plan: Desire for elective permanent sterilization History of heavy menses when not on oral contraceptive type 1 diabetes mellitus  Plan: Laparoscopic tubal sterilization of Falope-Rings 2/ 12/ 2013  [name] V 07/25/2011, 9:10 AM  History Reviewed.  Type I diabetic , Mental Illness, desiring permanent sterilization. No change in Plan or patient condition.  CBG this morning 94.

## 2011-07-26 LAB — HEMOGLOBIN A1C
Hgb A1c MFr Bld: 8 % — ABNORMAL HIGH (ref <5.7)
Mean Plasma Glucose: 183 mg/dL — ABNORMAL HIGH (ref <117)

## 2011-07-31 ENCOUNTER — Encounter (HOSPITAL_COMMUNITY)
Admission: RE | Disposition: A | Discharge status: Home / Self Care | Payer: Self-pay | Source: Ambulatory Visit | Attending: Obstetrics and Gynecology

## 2011-07-31 ENCOUNTER — Ambulatory Visit (HOSPITAL_COMMUNITY): Payer: Medicaid Other | Admitting: Anesthesiology

## 2011-07-31 ENCOUNTER — Encounter (HOSPITAL_COMMUNITY): Payer: Self-pay | Admitting: *Deleted

## 2011-07-31 ENCOUNTER — Encounter (HOSPITAL_COMMUNITY): Payer: Self-pay | Admitting: Anesthesiology

## 2011-07-31 ENCOUNTER — Ambulatory Visit (HOSPITAL_COMMUNITY)
Admission: RE | Admit: 2011-07-31 | Discharge: 2011-07-31 | Disposition: A | Discharge status: Home / Self Care | Payer: Medicaid Other | Source: Ambulatory Visit | Attending: Obstetrics and Gynecology | Admitting: Obstetrics and Gynecology

## 2011-07-31 DIAGNOSIS — E119 Type 2 diabetes mellitus without complications: Secondary | ICD-10-CM | POA: Diagnosis present

## 2011-07-31 DIAGNOSIS — Z01812 Encounter for preprocedural laboratory examination: Secondary | ICD-10-CM | POA: Insufficient documentation

## 2011-07-31 DIAGNOSIS — Z794 Long term (current) use of insulin: Secondary | ICD-10-CM | POA: Insufficient documentation

## 2011-07-31 DIAGNOSIS — Z302 Encounter for sterilization: Secondary | ICD-10-CM | POA: Insufficient documentation

## 2011-07-31 HISTORY — PX: LAPAROSCOPIC TUBAL LIGATION: SHX1937

## 2011-07-31 LAB — GLUCOSE, CAPILLARY: Glucose-Capillary: 94 mg/dL (ref 70–99)

## 2011-07-31 LAB — SURGICAL PCR SCREEN: MRSA, PCR: NEGATIVE

## 2011-07-31 SURGERY — LIGATION, FALLOPIAN TUBE, LAPAROSCOPIC
Anesthesia: General | Laterality: Bilateral | Wound class: Clean

## 2011-07-31 MED ORDER — MIDAZOLAM HCL 2 MG/2ML IJ SOLN
INTRAMUSCULAR | Status: AC
  Administered 2011-07-31: 2 mg via INTRAVENOUS
  Filled 2011-07-31: qty 2

## 2011-07-31 MED ORDER — MUPIROCIN 2 % EX OINT
TOPICAL_OINTMENT | CUTANEOUS | Status: AC
  Filled 2011-07-31: qty 22

## 2011-07-31 MED ORDER — PROPOFOL 10 MG/ML IV EMUL
INTRAVENOUS | Status: AC
  Filled 2011-07-31: qty 20

## 2011-07-31 MED ORDER — LIDOCAINE HCL 1 % IJ SOLN
INTRAMUSCULAR | Status: DC | PRN
  Administered 2011-07-31: 50 mg via INTRADERMAL

## 2011-07-31 MED ORDER — LIDOCAINE HCL (PF) 1 % IJ SOLN
INTRAMUSCULAR | Status: AC
  Filled 2011-07-31: qty 5

## 2011-07-31 MED ORDER — NEOSTIGMINE METHYLSULFATE 1 MG/ML IJ SOLN
INTRAMUSCULAR | Status: AC
  Filled 2011-07-31: qty 10

## 2011-07-31 MED ORDER — LACTATED RINGERS IV SOLN
INTRAVENOUS | Status: DC
  Administered 2011-07-31: 1000 mL via INTRAVENOUS

## 2011-07-31 MED ORDER — ONDANSETRON HCL 4 MG/2ML IJ SOLN
4.0000 mg | Freq: Once | INTRAMUSCULAR | Status: AC | PRN
  Administered 2011-07-31: 4 mg via INTRAVENOUS

## 2011-07-31 MED ORDER — BUPIVACAINE HCL (PF) 0.25 % IJ SOLN
INTRAMUSCULAR | Status: DC | PRN
  Administered 2011-07-31: 8 mL

## 2011-07-31 MED ORDER — ONDANSETRON HCL 4 MG/2ML IJ SOLN
INTRAMUSCULAR | Status: AC
  Filled 2011-07-31: qty 2

## 2011-07-31 MED ORDER — FENTANYL CITRATE 0.05 MG/ML IJ SOLN
INTRAMUSCULAR | Status: AC
  Filled 2011-07-31: qty 2

## 2011-07-31 MED ORDER — PROPOFOL 10 MG/ML IV BOLUS
INTRAVENOUS | Status: DC | PRN
  Administered 2011-07-31: 150 mg via INTRAVENOUS

## 2011-07-31 MED ORDER — ONDANSETRON HCL 4 MG/2ML IJ SOLN
INTRAMUSCULAR | Status: AC
  Administered 2011-07-31: 4 mg via INTRAVENOUS
  Filled 2011-07-31: qty 2

## 2011-07-31 MED ORDER — GLYCOPYRROLATE 0.2 MG/ML IJ SOLN
INTRAMUSCULAR | Status: AC
  Filled 2011-07-31: qty 2

## 2011-07-31 MED ORDER — FENTANYL CITRATE 0.05 MG/ML IJ SOLN
25.0000 ug | INTRAMUSCULAR | Status: DC | PRN
Start: 2011-07-31 — End: 2011-07-31

## 2011-07-31 MED ORDER — ROCURONIUM BROMIDE 100 MG/10ML IV SOLN
INTRAVENOUS | Status: DC | PRN
  Administered 2011-07-31: 30 mg via INTRAVENOUS

## 2011-07-31 MED ORDER — ONDANSETRON HCL 4 MG/2ML IJ SOLN
4.0000 mg | Freq: Once | INTRAMUSCULAR | Status: AC
  Administered 2011-07-31: 4 mg via INTRAVENOUS

## 2011-07-31 MED ORDER — NEOSTIGMINE METHYLSULFATE 1 MG/ML IJ SOLN
INTRAMUSCULAR | Status: DC | PRN
  Administered 2011-07-31: 3 mg via INTRAVENOUS

## 2011-07-31 MED ORDER — BUPIVACAINE HCL (PF) 0.25 % IJ SOLN
INTRAMUSCULAR | Status: AC
  Filled 2011-07-31: qty 30

## 2011-07-31 MED ORDER — FENTANYL CITRATE 0.05 MG/ML IJ SOLN
INTRAMUSCULAR | Status: DC | PRN
  Administered 2011-07-31 (×4): 50 ug via INTRAVENOUS

## 2011-07-31 MED ORDER — GLYCOPYRROLATE 0.2 MG/ML IJ SOLN
INTRAMUSCULAR | Status: DC | PRN
  Administered 2011-07-31: .6 mg via INTRAVENOUS

## 2011-07-31 MED ORDER — ROCURONIUM BROMIDE 50 MG/5ML IV SOLN
INTRAVENOUS | Status: AC
  Filled 2011-07-31: qty 1

## 2011-07-31 MED ORDER — MIDAZOLAM HCL 2 MG/2ML IJ SOLN
1.0000 mg | INTRAMUSCULAR | Status: DC | PRN
  Administered 2011-07-31: 2 mg via INTRAVENOUS

## 2011-07-31 SURGICAL SUPPLY — 35 items
BAG HAMPER (MISCELLANEOUS) ×2 IMPLANT
BLADE SURG SZ11 CARB STEEL (BLADE) ×2 IMPLANT
CATH ROBINSON RED A/P 16FR (CATHETERS) ×2 IMPLANT
CLOTH BEACON ORANGE TIMEOUT ST (SAFETY) ×2 IMPLANT
COVER LIGHT HANDLE STERIS (MISCELLANEOUS) ×4 IMPLANT
DECANTER SPIKE VIAL GLASS SM (MISCELLANEOUS) ×2 IMPLANT
DRESSING COVERLET 3X1 FLEXIBLE (GAUZE/BANDAGES/DRESSINGS) ×2 IMPLANT
ELECT REM PT RETURN 9FT ADLT (ELECTROSURGICAL) ×2
ELECTRODE REM PT RTRN 9FT ADLT (ELECTROSURGICAL) ×1 IMPLANT
GLOVE ECLIPSE 6.5 STRL STRAW (GLOVE) ×4 IMPLANT
GLOVE ECLIPSE 9.0 STRL (GLOVE) ×2 IMPLANT
GLOVE EXAM NITRILE MD LF STRL (GLOVE) ×4 IMPLANT
GLOVE INDICATOR 7.0 STRL GRN (GLOVE) ×4 IMPLANT
GLOVE INDICATOR STER SZ 9 (GLOVE) ×2 IMPLANT
GOWN STRL REIN 3XL LVL4 (GOWN DISPOSABLE) ×2 IMPLANT
GOWN STRL REIN XL XLG (GOWN DISPOSABLE) ×2 IMPLANT
INST SET LAPROSCOPIC GYN AP (KITS) ×2 IMPLANT
KIT ROOM TURNOVER AP CYSTO (KITS) ×2 IMPLANT
MANIFOLD NEPTUNE II (INSTRUMENTS) ×2 IMPLANT
NEEDLE INSUFFLATION 14GA 120MM (NEEDLE) IMPLANT
NS IRRIG 1000ML POUR BTL (IV SOLUTION) ×2 IMPLANT
PACK PERI GYN (CUSTOM PROCEDURE TRAY) ×2 IMPLANT
PAD ARMBOARD 7.5X6 YLW CONV (MISCELLANEOUS) ×2 IMPLANT
RING FALLOPIAN BANDS (Ring) ×2 IMPLANT
SET BASIN LINEN APH (SET/KITS/TRAYS/PACK) ×2 IMPLANT
SOL PREP PROV IODINE SCRUB 4OZ (MISCELLANEOUS) ×2 IMPLANT
SOLUTION ANTI FOG 6CC (MISCELLANEOUS) ×2 IMPLANT
STRIP CLOSURE SKIN 1/4X3 (GAUZE/BANDAGES/DRESSINGS) ×2 IMPLANT
SUT VIC AB 4-0 PS2 27 (SUTURE) IMPLANT
SYR BULB IRRIGATION 50ML (SYRINGE) ×2 IMPLANT
SYR CONTROL 10ML LL (SYRINGE) ×2 IMPLANT
TROCAR KII 8X100ML NONTHREADED (TROCAR) ×2 IMPLANT
TROCAR Z-THREAD FIOS 5X100MM (TROCAR) ×2 IMPLANT
TUBING INSUFFLATION HIGH FLOW (TUBING) ×2 IMPLANT
WARMER LAPAROSCOPE (MISCELLANEOUS) IMPLANT

## 2011-07-31 NOTE — Anesthesia Preprocedure Evaluation (Signed)
Anesthesia Evaluation  Patient identified by MRN, date of birth, ID band Patient awake    Reviewed: Allergy & Precautions, H&P , NPO status , Patient's Chart, lab work & pertinent test results  Airway Mallampati: I      Dental  (+) Teeth Intact   Pulmonary neg pulmonary ROS,  clear to auscultation        Cardiovascular Regular Normal    Neuro/Psych Seizures -, Well Controlled,  Anxiety Bipolar Disorder    GI/Hepatic   Endo/Other  Diabetes mellitus-, Well Controlled, Type 2, Insulin Dependent  Renal/GU      Musculoskeletal  (+) Fibromyalgia -  Abdominal   Peds  Hematology   Anesthesia Other Findings   Reproductive/Obstetrics                           Anesthesia Physical Anesthesia Plan  ASA: III  Anesthesia Plan: General   Post-op Pain Management:    Induction: Intravenous  Airway Management Planned: Oral ETT  Additional Equipment:   Intra-op Plan:   Post-operative Plan: Extubation in OR  Informed Consent: I have reviewed the patients History and Physical, chart, labs and discussed the procedure including the risks, benefits and alternatives for the proposed anesthesia with the patient or authorized representative who has indicated his/her understanding and acceptance.     Plan Discussed with:   Anesthesia Plan Comments:         Anesthesia Quick Evaluation

## 2011-07-31 NOTE — Transfer of Care (Signed)
Immediate Anesthesia Transfer of Care Note  Patient: [name]  Procedure(s) Performed: Procedure(s) (LRB): LAPAROSCOPIC TUBAL LIGATION (Bilateral)  Patient Location: PACU  Anesthesia Type: General  Level of Consciousness: awake, alert  and oriented  Airway & Oxygen Therapy: Patient Spontanous Breathing and Patient connected to face mask oxygen  Post-op Assessment: Report given to PACU RN and Post -op Vital signs reviewed and stable  Post vital signs: Reviewed and stable  Complications: No apparent anesthesia complications

## 2011-07-31 NOTE — Brief Op Note (Signed)
07/31/2011  8:22 AM  PATIENT:  [name]  36 y.o. female  PRE-OPERATIVE DIAGNOSIS:  sterilization request  POST-OPERATIVE DIAGNOSIS:  sterilization request  PROCEDURE:  Procedure(s) (LRB): LAPAROSCOPIC TUBAL LIGATION (Bilateral) fallopian rings  SURGEON:  Surgeon(s) and Role:    * [name], MD - Primary  PHYSICIAN ASSISTANT:   ASSISTANTS: Witt, CST   ANESTHESIA:   local and general  EBL:  Total I/O In: 200 [I.V.:200] Out: -   BLOOD ADMINISTERED:none  DRAINS: none   LOCAL MEDICATIONS USED:  MARCAINE     SPECIMEN:  No Specimen  DISPOSITION OF SPECIMEN:  N/A  COUNTS:  YES  TOURNIQUET:  * No tourniquets in log *  DICTATION: .Dragon Dictation Patient was taken to the operating room, prepped and draped for combined abdominal and vaginal procedure with in and out catheterization of the bladder and Hulka tenaculum attached to the cervix for uterine manipulation. Had been conducted and surgical team  agreed with planned procedure. An infraumbilical vertical 1 cm skin incision was made as well as a transverse suprapubic 1 cm incision in the old C-section scar. Sleeve was used to achieve pneumoperitoneum with care, and 5 mm laparoscopic camera inserted with caution and abdominal contents visualized as normal. Suprapubic 8 mm trocar was inserted under direct visualization. Was directed to the left tube which was grasped in its midportion, Falope ring applied and percutaneous Marcaine injection above and below the Falope ring. Photo documentation was performed. The opposite tube was treated similarly, and the abdomen deflated, 180 cc of saline placed in the abdominal cavity to assist with evacuation and pneumoperitoneum and instruments removed, subcuticular 4-0 Vicryl used to close the skin Steri-Strips applied sponge and needle counts were correct the patient to recovery room in stable condition.  PLAN OF CARE: Discharge to home after PACU  PATIENT DISPOSITION:  PACU -  hemodynamically stable.   Delay start of Pharmacological VTE agent (>24hrs) due to surgical blood loss or risk of bleeding: yes

## 2011-07-31 NOTE — Discharge Instructions (Addendum)
Instructions Following General Anesthetic, Adult A nurse specialized in giving anesthesia (anesthetist) or a doctor specialized in giving anesthesia (anesthesiologist) gave you a medicine that made you sleep while a procedure was performed. For as long as 24 hours following this procedure, you may feel:  Dizzy.   Weak.   Drowsy.  AFTER THE PROCEDURE After surgery, you will be taken to the recovery area where a nurse will monitor your progress. You will be allowed to go home when you are awake, stable, taking fluids well, and without complications. For the first 24 hours following an anesthetic:  Have a responsible person with you.   Do not drive a car. If you are alone, do not take public transportation.   Do not drink alcohol.   Do not take medicine that has not been prescribed by your caregiver.   Do not sign important papers or make important decisions.   You may resume normal diet and activities as directed.   Change bandages (dressings) as directed.   Only take over-the-counter or prescription medicines for pain, discomfort, or fever as directed by your caregiver.  If you have questions or problems that seem related to the anesthetic, call the hospital and ask for the anesthetist or anesthesiologist on call. SEEK IMMEDIATE MEDICAL CARE IF:   You develop a rash.   You have difficulty breathing.   You have chest pain.   You develop any allergic problems.  Document Released: 09/10/2000 Document Revised: 02/14/2011 Document Reviewed: 04/21/2007 [facility] Patient Information 2012 [place], [place].Laparoscopic Tubal Ligation Care After Laparoscopic tubal ligation is an operation done with a long, lighted tube inserted through a small cut (incision) in the abdomen. The fallopian tubes are blocked by tying, clamping with a plastic clamp, or burning them closed with an electrocautery. Read the instructions outlined below and refer to this sheet in the next few weeks. These  instructions provide you with general information on caring for yourself after you leave the hospital. Your caregiver may also give you specific instructions. While your treatment has been planned according to the most current medical practices available, unavoidable complications may occur. If you have any problems or questions after discharge, please call your caregiver. HOME CARE INSTRUCTIONS   It will be normal to be sore for a couple days following surgery.   Take your medicine and follow the instructions from your caregiver.   You may resume usual diet, exercise, driving and activities as allowed by your caregiver.   Do not have sexual intercourse until your caregiver gives you permission.   Do not drive while taking pain medicine.   Avoid lifting until you are instructed otherwise.   Use showers for bathing, until you are seen by your caregiver.   Change dressings if needed, and as directed.   Only take over-the-counter or prescription medicines for pain, discomfort or fever as directed by your caregiver.   Do not take aspirin because it can cause bleeding.   Take your temperature twice a day and record it.   Have someone stay with you the day you have the operation, and for a couple days afterward.   Make an appointment to see your caregiver for stitches (sutures) or staple removal and postoperative exams, as instructed.  SEEK MEDICAL CARE IF:   There is redness, swelling, or increasing pain in a wound.   There is drainage from a wound lasting longer than one day.   Your pain is getting worse.   You develop a rash.   You  are having a reaction to your medicine.   You become dizzy or lightheaded.   You need stronger medicine or a change in your pain medicine.   You notice a foul smell coming from a wound or dressing.   There is a breaking open of a wound after the stitches, staples, or skin adhesive strips have been removed.   You develop constipation.  SEEK  IMMEDIATE MEDICAL CARE IF:   You have an oral temperature above 102 F (38.9 C), not controlled by medicine.   There is increasing abdominal pain.   You develop pain in your shoulders (shoulder strap areas) which becomes more severe. Some pain is common, because of the gas inserted into your abdomen during the procedure.   You develop bleeding or drainage from the suture sites or vagina (birth canal) following surgery.   You pass out.   You develop shortness of breath or difficulty breathing.   You develop chest or leg pain.   You develop persistent nausea, vomiting or diarrhea.  MAKE SURE YOU:   Understand these instructions.   Watch your condition.   Get help right away if you are not doing well or get worse.  Document Released: 12/22/2004 Document Revised: 02/14/2011 Document Reviewed: 06/20/2009 [facility] Patient Information 2012 [place], [place].

## 2011-07-31 NOTE — Op Note (Signed)
See dictation included in brief of note

## 2011-07-31 NOTE — Interval H&P Note (Signed)
History and Physical Interval Note:  07/31/2011 7:28 AM  [name]  has presented today for surgery, with the diagnosis of sterilization request  The various methods of treatment have been discussed with the patient and family. After consideration of risks, benefits and other options for treatment, the patient has consented to  Procedure(s) (LRB): LAPAROSCOPIC TUBAL LIGATION (Bilateral) as a surgical intervention .  The patients' history has been reviewed, patient examined, no change in status, stable for surgery.  I have reviewed the patients' chart and labs.  Questions were answered to the patient's satisfaction.     [name]

## 2011-07-31 NOTE — Anesthesia Procedure Notes (Signed)
Procedure Name: Intubation Date/Time: 07/31/2011 7:46 AM Performed by: Glynn Octave Pre-anesthesia Checklist: Patient identified, Patient being monitored, Timeout performed, Emergency Drugs available and Suction available Patient Re-evaluated:Patient Re-evaluated prior to inductionOxygen Delivery Method: Circle System Utilized Preoxygenation: Pre-oxygenation with 100% oxygen Intubation Type: IV induction Ventilation: Mask ventilation without difficulty Laryngoscope Size: Mac and 3 Grade View: Grade I Tube type: Oral Tube size: 7.0 mm Number of attempts: 1 Airway Equipment and Method: stylet Placement Confirmation: ETT inserted through vocal cords under direct vision,  positive ETCO2 and breath sounds checked- equal and bilateral Secured at: 21 cm Tube secured with: Tape Dental Injury: Teeth and Oropharynx as per pre-operative assessment

## 2011-07-31 NOTE — Anesthesia Postprocedure Evaluation (Signed)
  Anesthesia Post-op Note  Patient: [name]  Procedure(s) Performed: Procedure(s) (LRB): LAPAROSCOPIC TUBAL LIGATION (Bilateral)  Patient Location: PACU  Anesthesia Type: General  Level of Consciousness: awake, alert  and oriented  Airway and Oxygen Therapy: Patient Spontanous Breathing, Patient connected to face mask oxygen and Patient connected to T-piece oxygen  Post-op Pain: mild  Post-op Assessment: Post-op Vital signs reviewed, Patient's Cardiovascular Status Stable, Respiratory Function Stable, Patent Airway and No signs of Nausea or vomiting  Post-op Vital Signs: Reviewed and stable  Complications: No apparent anesthesia complications

## 2011-08-01 ENCOUNTER — Encounter (HOSPITAL_COMMUNITY): Payer: Self-pay | Admitting: Obstetrics and Gynecology

## 2011-09-04 ENCOUNTER — Other Ambulatory Visit (HOSPITAL_COMMUNITY): Payer: Self-pay | Admitting: Family Medicine

## 2011-09-04 DIAGNOSIS — Z09 Encounter for follow-up examination after completed treatment for conditions other than malignant neoplasm: Secondary | ICD-10-CM

## 2011-09-12 ENCOUNTER — Ambulatory Visit (HOSPITAL_COMMUNITY): Admission: RE | Admit: 2011-09-12 | Payer: Medicaid Other | Source: Ambulatory Visit

## 2011-11-05 ENCOUNTER — Ambulatory Visit (HOSPITAL_COMMUNITY)
Admission: RE | Admit: 2011-11-05 | Discharge: 2011-11-05 | Disposition: A | Discharge status: Home / Self Care | Payer: Medicaid Other | Source: Ambulatory Visit | Attending: Family Medicine | Admitting: Family Medicine

## 2011-11-05 ENCOUNTER — Other Ambulatory Visit (HOSPITAL_COMMUNITY): Payer: Self-pay | Admitting: Family Medicine

## 2011-11-05 DIAGNOSIS — M545 Low back pain, unspecified: Secondary | ICD-10-CM | POA: Insufficient documentation

## 2011-11-05 DIAGNOSIS — M25569 Pain in unspecified knee: Secondary | ICD-10-CM | POA: Insufficient documentation

## 2011-11-05 DIAGNOSIS — M25519 Pain in unspecified shoulder: Secondary | ICD-10-CM | POA: Insufficient documentation

## 2011-11-13 ENCOUNTER — Other Ambulatory Visit (HOSPITAL_COMMUNITY): Payer: Self-pay | Admitting: Family Medicine

## 2011-11-13 DIAGNOSIS — M545 Low back pain: Secondary | ICD-10-CM

## 2011-11-19 ENCOUNTER — Ambulatory Visit (HOSPITAL_COMMUNITY): Admission: RE | Admit: 2011-11-19 | Payer: Medicaid Other | Source: Ambulatory Visit

## 2011-11-27 ENCOUNTER — Ambulatory Visit (INDEPENDENT_AMBULATORY_CARE_PROVIDER_SITE_OTHER): Payer: Medicaid Other | Admitting: Orthopedic Surgery

## 2011-11-27 ENCOUNTER — Encounter: Payer: Self-pay | Admitting: Orthopedic Surgery

## 2011-11-27 VITALS — BP 108/60 | Ht 62.25 in | Wt 139.0 lb

## 2011-11-27 DIAGNOSIS — S43109A Unspecified dislocation of unspecified acromioclavicular joint, initial encounter: Secondary | ICD-10-CM

## 2011-11-27 DIAGNOSIS — S4990XA Unspecified injury of shoulder and upper arm, unspecified arm, initial encounter: Secondary | ICD-10-CM

## 2011-11-27 DIAGNOSIS — S4980XA Other specified injuries of shoulder and upper arm, unspecified arm, initial encounter: Secondary | ICD-10-CM

## 2011-11-27 DIAGNOSIS — S43129A Dislocation of unspecified acromioclavicular joint, 100%-200% displacement, initial encounter: Secondary | ICD-10-CM | POA: Insufficient documentation

## 2011-11-27 NOTE — Patient Instructions (Signed)
We will refer you to Dr Dion Saucier

## 2011-11-27 NOTE — Progress Notes (Signed)
  Subjective:   Chief Complaint  Patient presents with  . Shoulder Pain    left shoulder pain since 11/2010, gradual onset, (old injury 2002)   A consultation has been requested by Dr. Willis Modena is a 36 y.o. female who presents with left shoulder pain. The symptoms began several years ago.  ~ 10 yrs ago. Aggravating factors: Include any flexion external rotation or extension of the arm. She basically sustained a grade 3 or grade 5 a.c. separation was treated with a sling appropriately at the time of presents now with increasing pain over the left shoulder which is described as sharp dull throbbing stabbing and burning. She rates her pain 9/10 even on Percocet 10 mg. She notices some swelling and radiation of the pain to but not below the elbow and also into the axilla. The following portions of the patient's history were reviewed and updated as appropriate: allergies, current medications, past family history, past medical history, past social history, past surgical history and problem list.  Positive systems review include snoring constipation nervousness anxiety depression easy bruising back pain. All other systems reviewed were normal Review of Systems Pertinent items are noted in HPI.   Objective:  BP 108/60  Ht 5' 2.25" (1.581 m)  Wt 139 lb (63.05 kg)  BMI 25.22 kg/m2  LMP 10/15/2011  Overall her body habitus is normal her BMI is normal. She is oriented x3 her mood and affect are normal she ambulates with a normal gait. Her right shoulder has no deformity no tenderness normal range of motion strength stability and alignment and normal skin. The extremity has normal pulses and temperature there is no lymphadenopathy sensation is normal but no pathologic reflexes. Coordination is normal  Left shoulder has a large mass which is actually distal clavicle protruding past the acromion or more accurately the shoulder is sagging away from the clavicle. Her passive range of motion  is normal but painful or stability is normal in the glenohumeral joint abnormal in the a.c. joint with distal clavicle mobility and a reducible a.c. joint. She has no weakness in the rotator cuff the skin is intact pulses normal lymph nodes are negative sensation is normal the reflexes are normal and the coordination and balance are normal.  Lower extremity exam  Ambulation is normal.  Inspection and palpation revealed no tenderness or abnormality in alignment in the lower extremities. Range of motion is full.  Strength is grade 5.   all joints are stable.    Assessment:    Left acromioclavicular joint sprain    Plan:    Orthopedics referral.

## 2011-11-29 ENCOUNTER — Other Ambulatory Visit: Payer: Self-pay | Admitting: *Deleted

## 2011-11-29 DIAGNOSIS — S4990XA Unspecified injury of shoulder and upper arm, unspecified arm, initial encounter: Secondary | ICD-10-CM

## 2011-12-11 ENCOUNTER — Ambulatory Visit (HOSPITAL_COMMUNITY)
Admission: RE | Admit: 2011-12-11 | Discharge: 2011-12-11 | Disposition: A | Discharge status: Home / Self Care | Payer: Medicaid Other | Source: Ambulatory Visit | Attending: Family Medicine | Admitting: Family Medicine

## 2011-12-11 ENCOUNTER — Other Ambulatory Visit (HOSPITAL_COMMUNITY): Payer: Medicaid Other

## 2011-12-11 DIAGNOSIS — M545 Low back pain, unspecified: Secondary | ICD-10-CM | POA: Insufficient documentation

## 2011-12-11 DIAGNOSIS — M5126 Other intervertebral disc displacement, lumbar region: Secondary | ICD-10-CM | POA: Insufficient documentation

## 2011-12-11 DIAGNOSIS — M79609 Pain in unspecified limb: Secondary | ICD-10-CM | POA: Insufficient documentation

## 2011-12-24 ENCOUNTER — Telehealth: Payer: Self-pay | Admitting: Orthopedic Surgery

## 2011-12-24 NOTE — Telephone Encounter (Signed)
Patient called today to follow up on status of referral to shoulder specialist in [facility]; had been seen here 11/27/11.  States has had a change in phone #, and was unsure if we were still trying to reach her:  Please note ph#, which she states is her only contact # now (it has been entered into her registration as #1 phone):   [phone].  Please contact patient with referral information.

## 2011-12-25 NOTE — Telephone Encounter (Signed)
Called to follow up with [facility], they state they did not receive our fax, refaxed info, patient aware

## 2012-02-11 ENCOUNTER — Telehealth: Payer: Self-pay | Admitting: Orthopedic Surgery

## 2012-02-11 NOTE — Telephone Encounter (Signed)
noted 

## 2012-02-11 NOTE — Telephone Encounter (Signed)
Patient called (Friday, 02/08/12) to asked about the referral appointment (to [facility]) as she said she had not heard yet.  She gave another new phone # (cell) [phone], as well as mother's 2 phone #'s [phone] and [phone].  Her demographic information is updated.  I called [facility] to relay the updated contact information.  Per Leotis Shames, they had tried to contact patient and did not reach, due to change of phone #'s.  Their office is to call patient as soon as possible to schedule an appointment.  Their office # [phone].

## 2012-02-24 ENCOUNTER — Encounter (HOSPITAL_COMMUNITY): Payer: Self-pay | Admitting: *Deleted

## 2012-02-24 ENCOUNTER — Emergency Department (HOSPITAL_COMMUNITY)
Admission: EM | Admit: 2012-02-24 | Discharge: 2012-02-24 | Disposition: A | Discharge status: Home / Self Care | Payer: Medicaid Other | Attending: Emergency Medicine | Admitting: Emergency Medicine

## 2012-02-24 DIAGNOSIS — G8929 Other chronic pain: Secondary | ICD-10-CM | POA: Insufficient documentation

## 2012-02-24 DIAGNOSIS — M545 Low back pain, unspecified: Secondary | ICD-10-CM

## 2012-02-24 DIAGNOSIS — F17200 Nicotine dependence, unspecified, uncomplicated: Secondary | ICD-10-CM | POA: Insufficient documentation

## 2012-02-24 DIAGNOSIS — E119 Type 2 diabetes mellitus without complications: Secondary | ICD-10-CM | POA: Insufficient documentation

## 2012-02-24 DIAGNOSIS — Z794 Long term (current) use of insulin: Secondary | ICD-10-CM | POA: Insufficient documentation

## 2012-02-24 DIAGNOSIS — IMO0001 Reserved for inherently not codable concepts without codable children: Secondary | ICD-10-CM | POA: Insufficient documentation

## 2012-02-24 DIAGNOSIS — Y92009 Unspecified place in unspecified non-institutional (private) residence as the place of occurrence of the external cause: Secondary | ICD-10-CM | POA: Insufficient documentation

## 2012-02-24 DIAGNOSIS — X503XXA Overexertion from repetitive movements, initial encounter: Secondary | ICD-10-CM | POA: Insufficient documentation

## 2012-02-24 DIAGNOSIS — T148XXA Other injury of unspecified body region, initial encounter: Secondary | ICD-10-CM

## 2012-02-24 HISTORY — DX: Pain in unspecified shoulder: M25.519

## 2012-02-24 HISTORY — DX: Other intervertebral disc degeneration, lumbar region: M51.36

## 2012-02-24 HISTORY — DX: Other chronic pain: G89.29

## 2012-02-24 HISTORY — DX: Other intervertebral disc degeneration, lumbar region without mention of lumbar back pain or lower extremity pain: M51.369

## 2012-02-24 HISTORY — DX: Dorsalgia, unspecified: M54.9

## 2012-02-24 MED ORDER — DIAZEPAM 5 MG PO TABS
5.0000 mg | ORAL_TABLET | Freq: Once | ORAL | Status: AC
  Administered 2012-02-24: 5 mg via ORAL
  Filled 2012-02-24: qty 1

## 2012-02-24 MED ORDER — KETOROLAC TROMETHAMINE 60 MG/2ML IM SOLN
60.0000 mg | Freq: Once | INTRAMUSCULAR | Status: AC
  Administered 2012-02-24: 60 mg via INTRAMUSCULAR
  Filled 2012-02-24: qty 2

## 2012-02-24 MED ORDER — HYDROCODONE-ACETAMINOPHEN 5-325 MG PO TABS
ORAL_TABLET | ORAL | Status: AC
Start: 2012-02-24 — End: 2012-03-05

## 2012-02-24 MED ORDER — METHOCARBAMOL 500 MG PO TABS: 1000.0000 mg | ORAL_TABLET | Freq: Four times a day (QID) | ORAL | Status: DC | PRN

## 2012-02-24 MED ORDER — OXYCODONE-ACETAMINOPHEN 5-325 MG PO TABS
1.0000 | ORAL_TABLET | Freq: Once | ORAL | Status: AC
  Administered 2012-02-24: 1 via ORAL
  Filled 2012-02-24: qty 1

## 2012-02-24 NOTE — ED Provider Notes (Signed)
History     CSN: [id number]  Arrival date & time 02/24/12  1618   First MD Initiated Contact with Patient 02/24/12 1643      Chief Complaint  Patient presents with  . Back Pain    HPI Pt was seen at 1655.  Per pt, c/o gradual onset and persistence of constant acute flair of her chronic low back "pain" for the past day.  Pt states the pain began after she was lifting and moving wood, as well as lifting and moving boxes.  States she was sitting on the floor for several hours sorting through boxes today.  Denies any change in her usual chronic pain pattern.  Pain worsens with palpation of the area and body position changes. Denies incont/retention of bowel or bladder, no saddle anesthesia, no focal motor weakness, no tingling/numbness in extremities, no fevers, no injury, no abd pain.   The symptoms have been associated with no other complaints. The patient has a significant history of similar symptoms previously, recently being evaluated for this complaint and multiple prior evals for same.     Past Medical History  Diagnosis Date  . Panic attacks   . Diabetes mellitus   . Suicidal thoughts   . Seizures     diabetic seizures in past-last was few yrs ago  . Fibromyalgia   . DDD (degenerative disc disease), lumbar   . Chronic back pain   . Chronic shoulder pain     Past Surgical History  Procedure Date  . Cesarean section 1998    x1  . Hernia repair     bilateral inguinal hernia repairs age 89  . Laparoscopic tubal ligation 07/31/2011    Procedure: LAPAROSCOPIC TUBAL LIGATION;  Surgeon: [name], MD;  Location: [facility];  Service: Gynecology;  Laterality: Bilateral;  laparoscopic bilateral tubal ligation with falope rings    Family History  Problem Relation Age of Onset  . Hypotension Neg Hx   . Anesthesia problems Neg Hx   . Malignant hyperthermia Neg Hx   . Pseudochol deficiency Neg Hx   . Heart disease    . Arthritis    . Cancer    . Diabetes      History    Substance Use Topics  . Smoking status: Current Everyday Smoker -- 0.5 packs/day for 20 years    Types: Cigarettes  . Smokeless tobacco: Not on file  . Alcohol Use: Yes     occasional    Review of Systems ROS: Statement: All systems negative except as marked or noted in the HPI; Constitutional: Negative for fever and chills. ; ; Eyes: Negative for eye pain, redness and discharge. ; ; ENMT: Negative for ear pain, hoarseness, nasal congestion, sinus pressure and sore throat. ; ; Cardiovascular: Negative for chest pain, palpitations, diaphoresis, dyspnea and peripheral edema. ; ; Respiratory: Negative for cough, wheezing and stridor. ; ; Gastrointestinal: Negative for nausea, vomiting, diarrhea, abdominal pain, blood in stool, hematemesis, jaundice and rectal bleeding. . ; ; Genitourinary: Negative for dysuria, flank pain and hematuria. ; ; Musculoskeletal: +LBP. Negative for neck pain. Negative for swelling and trauma.; ; Skin: Negative for pruritus, rash, abrasions, blisters, bruising and skin lesion.; ; Neuro: Negative for headache, lightheadedness and neck stiffness. Negative for weakness, altered level of consciousness , altered mental status, extremity weakness, paresthesias, involuntary movement, seizure and syncope.        Allergies  Penicillins and Levofloxacin  Home Medications   Current Outpatient Rx  Name Route Sig  Dispense Refill  . ALPRAZOLAM 1 MG PO TABS Oral Take 1 mg by mouth 4 (four) times daily.    . AMPHETAMINE-DEXTROAMPHETAMINE 20 MG PO TABS Oral Take 20 mg by mouth 2 (two) times daily.      Marland Kitchen GABAPENTIN 300 MG PO CAPS Oral Take 300 mg by mouth 3 (three) times daily.    . INSULIN ASPART 100 UNIT/ML [place] SOLN Subcutaneous Inject 5-10 Units into the skin 3 (three) times daily before meals. Per sliding scale    . INSULIN GLARGINE 100 UNIT/ML [place] SOLN Subcutaneous Inject 20 Units into the skin at bedtime.     Marland Kitchen NAPROXEN SODIUM 220 MG PO TABS Oral Take 220 mg by mouth daily as  needed. For pain    . TRAZODONE HCL 50 MG PO TABS Oral Take 150 mg by mouth at bedtime.      BP 103/67  Pulse 128  Temp 97.5 F (36.4 C)  Resp 20  Ht 5' 2.5" (1.588 m)  Wt 130 lb (58.968 kg)  BMI 23.40 kg/m2  SpO2 96%  LMP 01/31/2012  Physical Exam 1700: Physical examination:  Nursing notes reviewed; Vital signs and O2 SAT reviewed;  Constitutional: Well developed, Well nourished, Well hydrated, In no acute distress; Head:  Normocephalic, atraumatic; Eyes: EOMI, PERRL, No scleral icterus; ENMT: Mouth and pharynx normal, Mucous membranes moist; Neck: Supple, Full range of motion, No lymphadenopathy; Cardiovascular: Regular rate and rhythm, No murmur, rub, or gallop; Respiratory: Breath sounds clear & equal bilaterally, No rales, rhonchi, wheezes.  Speaking full sentences with ease, Normal respiratory effort/excursion; Chest: Nontender, Movement normal; Abdomen: Soft, Nontender, Nondistended, Normal bowel sounds; Genitourinary: No CVA tenderness; Spine:  No midline CS, TS, LS tenderness.  +TTP lumbar paraspinal muscles.;; Extremities: Pulses normal, No tenderness, No edema, No calf edema or asymmetry.; Neuro: AA&Ox3, Major CN grossly intact.  Speech clear. Strength 5/5 equal bilat UE's and LE's, including great toe dorsiflexion.  DTR 2/4 equal bilat UE's and LE's.  No gross sensory deficits.  Neg straight leg raises bilat.;;; Skin: Color normal, Warm, Dry.   ED Course  Procedures    MDM  MDM Reviewed: nursing note, vitals and previous chart Reviewed previous: MRI      1745:  Long hx of chronic back pain.  Pt endorses acute flair of her usual long standing chronic pain today, no change from her usual chronic pain pattern.  Pt encouraged to f/u with her PMD for good continuity of care and control of her chronic pain.  Verb understanding.        Laray Anger, DO 02/27/12 1733

## 2012-02-24 NOTE — ED Notes (Signed)
Pt c/o lower back pain, states that she was sitting on a wood floor going through items for over two hours and thinks that she may have "pullled something in her lower back."

## 2012-02-29 ENCOUNTER — Emergency Department (HOSPITAL_COMMUNITY)
Admission: EM | Admit: 2012-02-29 | Discharge: 2012-02-29 | Disposition: A | Discharge status: Home / Self Care | Payer: Medicaid Other | Attending: Emergency Medicine | Admitting: Emergency Medicine

## 2012-02-29 ENCOUNTER — Encounter (HOSPITAL_COMMUNITY): Payer: Self-pay | Admitting: *Deleted

## 2012-02-29 ENCOUNTER — Emergency Department (HOSPITAL_COMMUNITY): Payer: Medicaid Other

## 2012-02-29 DIAGNOSIS — E119 Type 2 diabetes mellitus without complications: Secondary | ICD-10-CM | POA: Insufficient documentation

## 2012-02-29 DIAGNOSIS — M549 Dorsalgia, unspecified: Secondary | ICD-10-CM | POA: Insufficient documentation

## 2012-02-29 DIAGNOSIS — IMO0002 Reserved for concepts with insufficient information to code with codable children: Secondary | ICD-10-CM | POA: Insufficient documentation

## 2012-02-29 DIAGNOSIS — Z794 Long term (current) use of insulin: Secondary | ICD-10-CM | POA: Insufficient documentation

## 2012-02-29 DIAGNOSIS — R109 Unspecified abdominal pain: Secondary | ICD-10-CM | POA: Insufficient documentation

## 2012-02-29 DIAGNOSIS — N949 Unspecified condition associated with female genital organs and menstrual cycle: Secondary | ICD-10-CM | POA: Insufficient documentation

## 2012-02-29 LAB — COMPREHENSIVE METABOLIC PANEL
ALT: 13 U/L (ref 0–35)
Alkaline Phosphatase: 95 U/L (ref 39–117)
CO2: 26 mEq/L (ref 19–32)
GFR calc Af Amer: >90 mL/min (ref >90)
GFR calc non Af Amer: >90 mL/min (ref >90)
Glucose, Bld: 183 mg/dL — ABNORMAL HIGH (ref 70–99)
Potassium: 4.1 mEq/L (ref 3.5–5.1)
Sodium: 136 mEq/L (ref 135–145)
Total Protein: 7.2 g/dL (ref 6.0–8.3)

## 2012-02-29 LAB — URINALYSIS, ROUTINE W REFLEX MICROSCOPIC
Glucose, UA: NEGATIVE mg/dL
Protein, ur: NEGATIVE mg/dL

## 2012-02-29 LAB — URINE MICROSCOPIC-ADD ON

## 2012-02-29 LAB — CBC
Hemoglobin: 14 g/dL (ref 12.0–15.0)
RBC: 4.24 MIL/uL (ref 3.87–5.11)

## 2012-02-29 MED ORDER — OXYCODONE-ACETAMINOPHEN 5-325 MG PO TABS
ORAL_TABLET | ORAL | Status: AC
  Administered 2012-02-29: 1 via ORAL
  Filled 2012-02-29: qty 1

## 2012-02-29 MED ORDER — IOHEXOL 300 MG/ML  SOLN
100.0000 mL | Freq: Once | INTRAMUSCULAR | Status: AC | PRN
  Administered 2012-02-29: 100 mL via INTRAVENOUS

## 2012-02-29 MED ORDER — OXYCODONE-ACETAMINOPHEN 5-325 MG PO TABS
1.0000 | ORAL_TABLET | Freq: Once | ORAL | Status: AC
  Administered 2012-02-29: 1 via ORAL

## 2012-02-29 NOTE — ED Provider Notes (Signed)
History     CSN: [id number]  Arrival date & time 02/29/12  8295   First MD Initiated Contact with Patient 02/29/12 2027      Chief Complaint  Patient presents with  . Abdominal Pain    (Consider location/radiation/quality/duration/timing/severity/associated sxs/prior treatment) Patient is a 36 y.o. female presenting with abdominal pain. The history is provided by the patient.  Abdominal Pain The primary symptoms of the illness do not include fever, shortness of breath, vomiting, diarrhea, dysuria, vaginal discharge or vaginal bleeding.  Additional symptoms associated with the illness include back pain. Symptoms associated with the illness do not include chills or hematuria.  pt c/o low back pain for past 2 weeks. Constant, dull, non radiating. States has hx chronic low back pain. Saw primary care doctor today w same, mentioned that pain occasionally radiates around to abdomen - was sent to ed for ct scan. Pt w normal appetite. No nvd. No constipation. No fever or chills. No dysuria. No vaginal discharge or bleeding. States pain currently in back, no persistent, constant, or focal abd pain. Denies radicular pain. No leg numbness/weakness. No back injury or fall.      Past Medical History  Diagnosis Date  . Panic attacks   . Diabetes mellitus   . Suicidal thoughts   . Seizures     diabetic seizures in past-last was few yrs ago  . Fibromyalgia   . DDD (degenerative disc disease), lumbar   . Chronic back pain   . Chronic shoulder pain     Past Surgical History  Procedure Date  . Cesarean section 1998    x1  . Hernia repair     bilateral inguinal hernia repairs age 31  . Laparoscopic tubal ligation 07/31/2011    Procedure: LAPAROSCOPIC TUBAL LIGATION;  Surgeon: [name], MD;  Location: [facility];  Service: Gynecology;  Laterality: Bilateral;  laparoscopic bilateral tubal ligation with falope rings    Family History  Problem Relation Age of Onset  . Hypotension Neg Hx     . Anesthesia problems Neg Hx   . Malignant hyperthermia Neg Hx   . Pseudochol deficiency Neg Hx   . Heart disease    . Arthritis    . Cancer    . Diabetes      History  Substance Use Topics  . Smoking status: Current Every Day Smoker -- 0.5 packs/day for 20 years    Types: Cigarettes  . Smokeless tobacco: Not on file  . Alcohol Use: No     occasional    OB History    Grav Para Term Preterm Abortions TAB SAB Ect Mult Living                  Review of Systems  Constitutional: Negative for fever and chills.  HENT: Negative for neck pain.   Eyes: Negative for redness.  Respiratory: Negative for cough and shortness of breath.   Cardiovascular: Negative for chest pain.  Gastrointestinal: Negative for vomiting and diarrhea.  Genitourinary: Negative for dysuria, hematuria, flank pain, vaginal bleeding and vaginal discharge.  Musculoskeletal: Positive for back pain.  Skin: Negative for rash.  Neurological: Negative for headaches.  Hematological: Does not bruise/bleed easily.  Psychiatric/Behavioral: Negative for confusion.    Allergies  Penicillins and Levofloxacin  Home Medications   Current Outpatient Rx  Name Route Sig Dispense Refill  . ALPRAZOLAM 1 MG PO TABS Oral Take 1 mg by mouth 4 (four) times daily.    . AMPHETAMINE-DEXTROAMPHETAMINE 20 MG  PO TABS Oral Take 20 mg by mouth 2 (two) times daily.      Marland Kitchen GABAPENTIN 300 MG PO CAPS Oral Take 300 mg by mouth 3 (three) times daily.    . INSULIN ASPART 100 UNIT/ML [place] SOLN Subcutaneous Inject 5-10 Units into the skin 3 (three) times daily before meals. Per sliding scale    . INSULIN GLARGINE 100 UNIT/ML [place] SOLN Subcutaneous Inject 20 Units into the skin at bedtime.     Marland Kitchen NAPROXEN SODIUM 220 MG PO TABS Oral Take 220 mg by mouth daily as needed. For pain    . TRAZODONE HCL 50 MG PO TABS Oral Take 150 mg by mouth at bedtime.    Marland Kitchen HYDROCODONE-ACETAMINOPHEN 5-325 MG PO TABS  1 or 2 tabs PO q6 hours prn pain 20 tablet 0     BP 96/60  Pulse 90  Temp 98 F (36.7 C) (Oral)  Resp 20  Ht 5\' 2"  (1.575 m)  Wt 135 lb (61.236 kg)  BMI 24.69 kg/m2  SpO2 100%  LMP 02/29/2012  Physical Exam  Nursing note and vitals reviewed. Constitutional: She appears well-developed and well-nourished. No distress.  HENT:  Mouth/Throat: Oropharynx is clear and moist.  Eyes: Conjunctivae normal are normal. No scleral icterus.  Neck: Neck supple. No tracheal deviation present.  Cardiovascular: Normal rate, regular rhythm, normal heart sounds and intact distal pulses.   Pulmonary/Chest: Effort normal and breath sounds normal. No respiratory distress.  Abdominal: Soft. Normal appearance and bowel sounds are normal. She exhibits no distension and no mass. There is no tenderness. There is no rebound and no guarding.  Genitourinary:       No cva tenderness  Musculoskeletal: She exhibits no edema.       CTLS spine, non tender, aligned, no step off.   Neurological: She is alert.       Motor intact bil. Steady gait.   Skin: Skin is warm and dry. No rash noted.    ED Course  Procedures (including critical care time)  Labs Reviewed  COMPREHENSIVE METABOLIC PANEL - Abnormal; Notable for the following:    Glucose, Bld 183 (*)     Total Bilirubin 0.2 (*)     All other components within normal limits  URINALYSIS, ROUTINE W REFLEX MICROSCOPIC - Abnormal; Notable for the following:    Hgb urine dipstick MODERATE (*)     All other components within normal limits  CBC  LIPASE, BLOOD  PREGNANCY, URINE  URINE MICROSCOPIC-ADD ON   Results for orders placed during the hospital encounter of 02/29/12  CBC      Component Value Range   WBC 10.5  4.0 - 10.5 K/uL   RBC 4.24  3.87 - 5.11 MIL/uL   Hemoglobin 14.0  12.0 - 15.0 g/dL   HCT 11.9  14.7 - 82.9 %   MCV 98.3  78.0 - 100.0 fL   MCH 33.0  26.0 - 34.0 pg   MCHC 33.6  30.0 - 36.0 g/dL   RDW 56.2  13.0 - 86.5 %   Platelets 265  150 - 400 K/uL  COMPREHENSIVE METABOLIC PANEL       Component Value Range   Sodium 136  135 - 145 mEq/L   Potassium 4.1  3.5 - 5.1 mEq/L   Chloride 99  96 - 112 mEq/L   CO2 26  19 - 32 mEq/L   Glucose, Bld 183 (*) 70 - 99 mg/dL   BUN 10  6 - 23 mg/dL  Creatinine, Ser 0.76  0.50 - 1.10 mg/dL   Calcium 9.3  8.4 - 96.0 mg/dL   Total Protein 7.2  6.0 - 8.3 g/dL   Albumin 3.8  3.5 - 5.2 g/dL   AST 17  0 - 37 U/L   ALT 13  0 - 35 U/L   Alkaline Phosphatase 95  39 - 117 U/L   Total Bilirubin 0.2 (*) 0.3 - 1.2 mg/dL   GFR calc non Af Amer >90  >90 mL/min   GFR calc Af Amer >90  >90 mL/min  LIPASE, BLOOD      Component Value Range   Lipase 30  11 - 59 U/L  PREGNANCY, URINE      Component Value Range   Preg Test, Ur NEGATIVE  NEGATIVE  URINALYSIS, ROUTINE W REFLEX MICROSCOPIC      Component Value Range   Color, Urine YELLOW  YELLOW   APPearance CLEAR  CLEAR   Specific Gravity, Urine 1.010  1.005 - 1.030   pH 5.0  5.0 - 8.0   Glucose, UA NEGATIVE  NEGATIVE mg/dL   Hgb urine dipstick MODERATE (*) NEGATIVE   Bilirubin Urine NEGATIVE  NEGATIVE   Ketones, ur NEGATIVE  NEGATIVE mg/dL   Protein, ur NEGATIVE  NEGATIVE mg/dL   Urobilinogen, UA 0.2  0.0 - 1.0 mg/dL   Nitrite NEGATIVE  NEGATIVE   Leukocytes, UA NEGATIVE  NEGATIVE  URINE MICROSCOPIC-ADD ON      Component Value Range   Squamous Epithelial / LPF RARE  RARE   WBC, UA 0-2  <3 WBC/hpf   RBC / HPF 7-10  <3 RBC/hpf   Bacteria, UA RARE  RARE   Ct Abdomen Pelvis W Contrast  02/29/2012  *RADIOLOGY REPORT*  Clinical Data: 36 year old female with abdominal and pelvic pain.  CT ABDOMEN AND PELVIS WITH CONTRAST  Technique:  Multidetector CT imaging of the abdomen and pelvis was performed following the standard protocol during bolus administration of intravenous contrast.  Contrast: OMNIPAQUE IOHEXOL 300 MG/ML  SOLN  Comparison: None  Findings: The liver, spleen, pancreas, adrenal glands, kidneys and gallbladder are unremarkable.  No free fluid, enlarged lymph nodes, biliary dilation  or abdominal aortic aneurysm identified.  The bowel and bladder are within normal limits.  The appendix is normal.  Tubal ligation clips are noted.  No acute or suspicious bony abnormalities are identified. A mild broad-based disc bulge at L5-S1 noted.  IMPRESSION: No evidence of acute or significant abnormality.   Original Report Authenticated By: Rosendo Gros, M.D.        MDM  Labs and ct ordered from triage as request primary care doctor who had seen pt earlier today.  Reviewed nursing notes and prior charts for additional history.   Discussed ct w pt incl ddd, disc bulge at l5/s1.           Suzi Roots, MD 02/29/12 2141

## 2012-02-29 NOTE — ED Notes (Signed)
abd and back pain for 2 weeks, seen here for same. Recently.

## 2012-08-03 ENCOUNTER — Encounter (HOSPITAL_COMMUNITY): Payer: Self-pay | Admitting: Emergency Medicine

## 2012-08-03 ENCOUNTER — Inpatient Hospital Stay (HOSPITAL_COMMUNITY)
Admission: EM | Admit: 2012-08-03 | Discharge: 2012-08-07 | DRG: 919 | Disposition: A | Discharge status: Home / Self Care | Payer: Medicaid Other | Attending: Family Medicine | Admitting: Family Medicine

## 2012-08-03 DIAGNOSIS — T85694A Other mechanical complication of insulin pump, initial encounter: Principal | ICD-10-CM | POA: Diagnosis present

## 2012-08-03 DIAGNOSIS — Y92009 Unspecified place in unspecified non-institutional (private) residence as the place of occurrence of the external cause: Secondary | ICD-10-CM

## 2012-08-03 DIAGNOSIS — R197 Diarrhea, unspecified: Secondary | ICD-10-CM

## 2012-08-03 DIAGNOSIS — IMO0001 Reserved for inherently not codable concepts without codable children: Secondary | ICD-10-CM | POA: Diagnosis present

## 2012-08-03 DIAGNOSIS — Z791 Long term (current) use of non-steroidal anti-inflammatories (NSAID): Secondary | ICD-10-CM

## 2012-08-03 DIAGNOSIS — F329 Major depressive disorder, single episode, unspecified: Secondary | ICD-10-CM | POA: Diagnosis present

## 2012-08-03 DIAGNOSIS — E1110 Type 2 diabetes mellitus with ketoacidosis without coma: Secondary | ICD-10-CM

## 2012-08-03 DIAGNOSIS — M545 Low back pain, unspecified: Secondary | ICD-10-CM | POA: Diagnosis present

## 2012-08-03 DIAGNOSIS — Z794 Long term (current) use of insulin: Secondary | ICD-10-CM

## 2012-08-03 DIAGNOSIS — G4700 Insomnia, unspecified: Secondary | ICD-10-CM | POA: Diagnosis present

## 2012-08-03 DIAGNOSIS — F3289 Other specified depressive episodes: Secondary | ICD-10-CM | POA: Diagnosis present

## 2012-08-03 DIAGNOSIS — Y849 Medical procedure, unspecified as the cause of abnormal reaction of the patient, or of later complication, without mention of misadventure at the time of the procedure: Secondary | ICD-10-CM | POA: Diagnosis present

## 2012-08-03 DIAGNOSIS — G8929 Other chronic pain: Secondary | ICD-10-CM | POA: Diagnosis present

## 2012-08-03 DIAGNOSIS — E1010 Type 1 diabetes mellitus with ketoacidosis without coma: Secondary | ICD-10-CM | POA: Diagnosis present

## 2012-08-03 DIAGNOSIS — F988 Other specified behavioral and emotional disorders with onset usually occurring in childhood and adolescence: Secondary | ICD-10-CM | POA: Diagnosis present

## 2012-08-03 DIAGNOSIS — M069 Rheumatoid arthritis, unspecified: Secondary | ICD-10-CM | POA: Diagnosis present

## 2012-08-03 DIAGNOSIS — F410 Panic disorder [episodic paroxysmal anxiety] without agoraphobia: Secondary | ICD-10-CM | POA: Diagnosis present

## 2012-08-03 DIAGNOSIS — F17200 Nicotine dependence, unspecified, uncomplicated: Secondary | ICD-10-CM | POA: Diagnosis present

## 2012-08-03 DIAGNOSIS — E860 Dehydration: Secondary | ICD-10-CM

## 2012-08-03 DIAGNOSIS — N390 Urinary tract infection, site not specified: Secondary | ICD-10-CM | POA: Diagnosis present

## 2012-08-03 LAB — CBC WITH DIFFERENTIAL/PLATELET
Basophils Absolute: 0 10*3/uL (ref 0.0–0.1)
Eosinophils Absolute: 0 10*3/uL (ref 0.0–0.7)
Eosinophils Relative: 0 % (ref 0–5)
MCH: 32.9 pg (ref 26.0–34.0)
MCHC: 33.3 g/dL (ref 30.0–36.0)
MCV: 98.6 fL (ref 78.0–100.0)
Monocytes Absolute: 0.4 10*3/uL (ref 0.1–1.0)
Platelets: 303 10*3/uL (ref 150–400)
RDW: 13.4 % (ref 11.5–15.5)

## 2012-08-03 LAB — GLUCOSE, CAPILLARY
Glucose-Capillary: >600 mg/dL (ref 70–99)
Glucose-Capillary: >600 mg/dL (ref 70–99)
Glucose-Capillary: 371 mg/dL — ABNORMAL HIGH (ref 70–99)
Glucose-Capillary: 219 mg/dL — ABNORMAL HIGH (ref 70–99)
Glucose-Capillary: 131 mg/dL — ABNORMAL HIGH (ref 70–99)
Glucose-Capillary: 109 mg/dL — ABNORMAL HIGH (ref 70–99)
Glucose-Capillary: 170 mg/dL — ABNORMAL HIGH (ref 70–99)

## 2012-08-03 LAB — BASIC METABOLIC PANEL
BUN: 19 mg/dL (ref 6–23)
Calcium: 9.6 mg/dL (ref 8.4–10.5)
Calcium: 8.4 mg/dL (ref 8.4–10.5)
Creatinine, Ser: 0.84 mg/dL (ref 0.50–1.10)
GFR calc Af Amer: >90 mL/min (ref >90)
GFR calc non Af Amer: 88 mL/min — ABNORMAL LOW (ref >90)
GFR calc non Af Amer: >90 mL/min (ref >90)
GFR calc non Af Amer: >90 mL/min (ref >90)
Glucose, Bld: 137 mg/dL — ABNORMAL HIGH (ref 70–99)
Potassium: 3.7 mEq/L (ref 3.5–5.1)
Sodium: 128 mEq/L — ABNORMAL LOW (ref 135–145)
Sodium: 137 mEq/L (ref 135–145)
Sodium: 136 mEq/L (ref 135–145)

## 2012-08-03 LAB — URINE MICROSCOPIC-ADD ON

## 2012-08-03 LAB — URINALYSIS, ROUTINE W REFLEX MICROSCOPIC
Bilirubin Urine: NEGATIVE
Bilirubin Urine: NEGATIVE
Ketones, ur: >80 mg/dL — AB
Nitrite: NEGATIVE
Protein, ur: NEGATIVE mg/dL
Protein, ur: NEGATIVE mg/dL
Specific Gravity, Urine: 1.015 (ref 1.005–1.030)
Urobilinogen, UA: 0.2 mg/dL (ref 0.0–1.0)
Urobilinogen, UA: 0.2 mg/dL (ref 0.0–1.0)

## 2012-08-03 MED ORDER — SODIUM CHLORIDE 0.9 % IV SOLN
INTRAVENOUS | Status: DC
  Administered 2012-08-03 – 2012-08-06 (×4): via INTRAVENOUS

## 2012-08-03 MED ORDER — SODIUM CHLORIDE 0.9 % IV SOLN
Freq: Once | INTRAVENOUS | Status: AC
  Administered 2012-08-03: 11:00:00 via INTRAVENOUS

## 2012-08-03 MED ORDER — MIRTAZAPINE 30 MG PO TABS
30.0000 mg | ORAL_TABLET | Freq: Every day | ORAL | Status: DC
  Administered 2012-08-03 – 2012-08-06 (×4): 30 mg via ORAL
  Filled 2012-08-03 (×4): qty 1

## 2012-08-03 MED ORDER — INSULIN REGULAR BOLUS VIA INFUSION
5.0000 [IU] | Freq: Three times a day (TID) | INTRAVENOUS | Status: DC
  Administered 2012-08-04: 5 [IU] via INTRAVENOUS
  Filled 2012-08-03: qty 5

## 2012-08-03 MED ORDER — SODIUM CHLORIDE 0.9 % IV SOLN
INTRAVENOUS | Status: DC
  Administered 2012-08-03: 5.4 [IU]/h via INTRAVENOUS
  Filled 2012-08-03: qty 1

## 2012-08-03 MED ORDER — DEXTROSE-NACL 5-0.45 % IV SOLN
INTRAVENOUS | Status: DC
  Administered 2012-08-03: 17:00:00 via INTRAVENOUS
  Administered 2012-08-04: 1000 mL via INTRAVENOUS

## 2012-08-03 MED ORDER — METOCLOPRAMIDE HCL 5 MG/ML IJ SOLN
10.0000 mg | Freq: Once | INTRAMUSCULAR | Status: AC
  Administered 2012-08-03: 10 mg via INTRAVENOUS
  Filled 2012-08-03: qty 2

## 2012-08-03 MED ORDER — ASPIRIN 81 MG PO CHEW
81.0000 mg | CHEWABLE_TABLET | Freq: Every day | ORAL | Status: DC
  Administered 2012-08-03 – 2012-08-07 (×5): 81 mg via ORAL
  Filled 2012-08-03 (×5): qty 1

## 2012-08-03 MED ORDER — SODIUM CHLORIDE 0.9 % IV SOLN
1000.0000 mL | Freq: Once | INTRAVENOUS | Status: AC
  Administered 2012-08-03: 1000 mL via INTRAVENOUS

## 2012-08-03 MED ORDER — SODIUM CHLORIDE 0.9 % IV SOLN: 1000.0000 mL | INTRAVENOUS | Status: DC

## 2012-08-03 MED ORDER — ONDANSETRON HCL 4 MG/2ML IJ SOLN
4.0000 mg | Freq: Once | INTRAMUSCULAR | Status: AC
  Administered 2012-08-03: 4 mg via INTRAVENOUS
  Filled 2012-08-03: qty 2

## 2012-08-03 MED ORDER — AMPHETAMINE-DEXTROAMPHETAMINE 10 MG PO TABS: 15.0000 mg | ORAL_TABLET | Freq: Two times a day (BID) | ORAL | Status: DC

## 2012-08-03 MED ORDER — ALPRAZOLAM 0.5 MG PO TABS
1.0000 mg | ORAL_TABLET | Freq: Three times a day (TID) | ORAL | Status: DC | PRN
  Administered 2012-08-03 – 2012-08-05 (×4): 1 mg via ORAL
  Filled 2012-08-03 (×2): qty 2
  Filled 2012-08-03: qty 1
  Filled 2012-08-03 (×2): qty 2

## 2012-08-03 MED ORDER — OXYCODONE-ACETAMINOPHEN 5-325 MG PO TABS
1.0000 | ORAL_TABLET | ORAL | Status: DC | PRN
  Administered 2012-08-03 – 2012-08-05 (×8): 1 via ORAL
  Filled 2012-08-03 (×8): qty 1

## 2012-08-03 MED ORDER — TRAZODONE HCL 50 MG PO TABS
100.0000 mg | ORAL_TABLET | Freq: Every day | ORAL | Status: DC
  Administered 2012-08-03 – 2012-08-06 (×4): 100 mg via ORAL
  Filled 2012-08-03 (×4): qty 2

## 2012-08-03 MED ORDER — SODIUM CHLORIDE 0.9 % IV SOLN
INTRAVENOUS | Status: DC
  Administered 2012-08-03: 9.8 [IU]/h via INTRAVENOUS

## 2012-08-03 MED ORDER — DEXTROSE 50 % IV SOLN: 25.0000 mL | INTRAVENOUS | Status: DC | PRN

## 2012-08-03 MED ORDER — MIRTAZAPINE 30 MG PO TBDP: 30.0000 mg | ORAL_TABLET | Freq: Every day | ORAL | Status: DC

## 2012-08-03 MED ORDER — DIPHENHYDRAMINE HCL 50 MG/ML IJ SOLN
25.0000 mg | Freq: Once | INTRAMUSCULAR | Status: AC
  Administered 2012-08-03: 25 mg via INTRAVENOUS
  Filled 2012-08-03: qty 1

## 2012-08-03 MED ORDER — ENOXAPARIN SODIUM 40 MG/0.4ML ~~LOC~~ SOLN
40.0000 mg | SUBCUTANEOUS | Status: DC
  Administered 2012-08-03 – 2012-08-06 (×4): 40 mg via SUBCUTANEOUS
  Filled 2012-08-03 (×4): qty 0.4

## 2012-08-03 NOTE — H&P (Signed)
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  According to her estimate, she takes mostly 15 units of NovoLog in the insulin pump per day.  She complained of polyuria, polydipsia for 24 hour period.  Blood sugar was 380 fasting yesterday morning.  This morning, registered high here in the ER at 681, and she is mildly acidotic with currently normal potassium.  She has been admitted for DKA.  She smokes half pack per day.  Does have cough, but no sputum.  No rigors, chills, or hemoptysis. She will be admitted with serial BMET, glucommander protocol, and we will continue insulin pump as prescribed and change her over to higher doses of short-acting insulin a.c. and at bedtime.  PAST MEDICAL HISTORY:  Significant for rheumatoid arthritis, fibromyalgia, depression, anxiety, panic disorder, insomnia, and chronic pain.  PAST SURGICAL HISTORY:  Remarkable for bilateral tubal ligation, bilateral herniorrhaphy.  CURRENT MEDICINES: 1. Ibuprofen 800 p.o. t.i.d. 2. Percocet 10, 5 times a day. 3. Adderall 20 mg p.o. b.i.d. 4. Xanax 1 mg p.o. q.i.d. p.r.n. 5. Methotrexate ordered 1 week ago for which she is noncompliant. 6. Trazodone 100-150 mg p.o. at bedtime. 7. Remeron 30 mg p.o. at bedtime along her insulin pump controlled by     Dr. Bowler Blas.  REVIEW OF SYSTEMS:  Negative for seizures, tremors, polyuria, polydipsia.  No nausea, vomiting, hematemesis, hematochezia.  The patient has been active.  SOCIAL HISTORY:  She is with a  boyfriend and 62 year old daughter, smoked half-pack per day.  Drinks occasionally.  PHYSICAL EXAMINATION:  VITAL SIGNS:  Blood pressure is 96/85, pulse is 110 and regular, temperature 98.4, respiratory rate is 16, O2 sat is 96%.  WBC is 17.1, sodium 128, CO2 of 16, potassium 4.6. EYES:  PERRLA intact.  Sclerae clear.  Conjunctivae pink. NECK:  No JVD.  No carotid bruits.  No thyromegaly.  No thyroid bruits. TONGUE:  Dry parched mucosa. LUNGS:  Prolonged expiratory phase.  Mild end-expiratory wheeze.  No rales or rhonchi appreciable. HEART:  Regular rhythm.  No S3, S4.  No heaves, thrills, or rubs. ABDOMEN:  Soft, nontender.  Bowel sounds normoactive.  No guarding, rebound, mass, or organomegaly. EXTREMITIES:  No clubbing, cyanosis, or edema. NEUROLOGIC:  Cranial nerves II through XII grossly intact.  The patient moves all 4 extremities.  Plantars are downgoing.  IMPRESSION: 1. Diabetic ketoacidosis with insulin pump. 2. Question functioning of pump and dosage, which is unknown at this     time. 3. Fibromyalgia. 4. Rheumatoid arthritis. 5. Depression. 6. Anxiety. 7. Insomnia. 8. Attention deficit issues.  PLAN:  To admit and continue all of her current medicines, put her on glucommander protocol on ICU.  Monitor BMET, electrolytes, phosphorus, and  potassium, and we will make further recommendations as the database expands.     Melvyn Novas, MD     RMD/MEDQ  D:  08/03/2012  T:  08/03/2012  Job:  [id number]

## 2012-08-03 NOTE — ED Notes (Signed)
CRITICAL VALUE ALERT  Critical value received: glucose 681  Date of notification:  07/24/12  Time of notification:  1142  Critical value read back:yes  Nurse who received alert:  Garald Braver MD notified (1st page):  Marcille Blanco  Time of first page:  1142  MD notified (2nd page):  Time of second page:  Responding MD: Lars Mage  Time MD responded:  [phone]

## 2012-08-03 NOTE — ED Provider Notes (Signed)
History    This chart was scribed for [name], MD by Melba Coon, ED Scribe. The patient was seen in room [facility] and the patient's care was started at 11:05AM.    CSN: [id number]  Arrival date & time 08/03/12  1010   First MD Initiated Contact with Patient 08/03/12 1028      Chief Complaint  Patient presents with  . Hyperglycemia    (Consider location/radiation/quality/duration/timing/severity/associated sxs/prior treatment) The history is provided by the patient. No language interpreter was used.   [name] is a 37 y.o. female who presents to the Emergency Department complaining of hyperglycemia with associated fatigue and malaise with an onset 2 days ago. She reports she has had DM since she was 37 years old. She checks her BS at home and reports they started reading high yesterday; she is on an insulin pump but reports she does not like this pump and that her sugars are either low or high. She has had this new brand of pump since October. She reports her other pump that she had for 9 years controlled her diabetes much better. She has been weak, dizzy, and lightheaded since onset. She has had increased warmth to her skin with sweats but no documented fever. She is on diasbilty for DM neuropathy and chronic back pain (2 bulging discs).  She also reports some inferior central CP and SOB since last night; she reports when she inhales "it is bubbling" and she is not getting all of her breath in.  She has also relates she has been been constipated and hadn't had a bowel movement for over a week, but she took Epson salts 2 days ago and now reports loose watery diarrhea >20 times since onset that has now stopped. She reports she vomited once here at the ED but none since onset.  Denies neck pain, sore throat, rash, back pain, abdominal pain, dysuria, or extremity pain, edema, weakness, numbness, or tingling. No other pertinent medical symptoms.  PCP: Dr.  Sudie Bailey Endocrinologist: Leo Rod  Past Medical History  Diagnosis Date  . Panic attacks   . Diabetes mellitus   . Suicidal thoughts   . Seizures     diabetic seizures in past-last was few yrs ago  . Fibromyalgia   . DDD (degenerative disc disease), lumbar   . Chronic back pain   . Chronic shoulder pain     Past Surgical History  Procedure Laterality Date  . Cesarean section  1998    x1  . Hernia repair      bilateral inguinal hernia repairs age 25  . Laparoscopic tubal ligation  07/31/2011    Procedure: LAPAROSCOPIC TUBAL LIGATION;  Surgeon: [name], MD;  Location: [facility];  Service: Gynecology;  Laterality: Bilateral;  laparoscopic bilateral tubal ligation with falope rings    Family History  Problem Relation Age of Onset  . Hypotension Neg Hx   . Anesthesia problems Neg Hx   . Malignant hyperthermia Neg Hx   . Pseudochol deficiency Neg Hx   . Heart disease    . Arthritis    . Cancer    . Diabetes      History  Substance Use Topics  . Smoking status: Current Every Day Smoker -- 0.50 packs/day for 20 years    Types: Cigarettes  . Smokeless tobacco: Not on file  . Alcohol Use: No     Comment: occasional   on disability for her diabetes  OB History  Grav Para Term Preterm Abortions TAB SAB Ect Mult Living                  Review of Systems 10 Systems reviewed and all are negative for acute change except as noted in the HPI.   Allergies  Penicillins and Levofloxacin  Home Medications   Current Outpatient Rx  Name  Route  Sig  Dispense  Refill  . ALPRAZolam (XANAX) 1 MG tablet   Oral   Take 1 mg by mouth 4 (four) times daily.         Marland Kitchen amphetamine-dextroamphetamine (ADDERALL) 20 MG tablet   Oral   Take 20 mg by mouth 2 (two) times daily.           . Aspirin-Acetaminophen (GOODY BODY PAIN) 500-325 MG PACK   Oral   Take 1 Package by mouth every 4 (four) hours as needed (Pain).         Marland Kitchen gabapentin (NEURONTIN) 300 MG capsule    Oral   Take 300 mg by mouth 3 (three) times daily.         Marland Kitchen ibuprofen (ADVIL,MOTRIN) 800 MG tablet   Oral   Take 800 mg by mouth 3 (three) times daily.         . insulin aspart (NOVOLOG) 100 UNIT/ML injection   Subcutaneous   Inject into the skin continuous. Injected Continuously Through Insulin Pump.         . Insulin Human (INSULIN PUMP) 100 unit/ml SOLN   Subcutaneous   Inject into the skin continuous.         . Magnesium Sulfate, Laxative, (EPSOM SALT PO)   Oral   Take by mouth daily as needed (Constipation).         . mirtazapine (REMERON) 30 MG tablet   Oral   Take 30 mg by mouth at bedtime.         Marland Kitchen oxyCODONE-acetaminophen (PERCOCET) 10-325 MG per tablet   Oral   Take 1 tablet by mouth every 4 (four) hours as needed for pain.         . sodium chloride (OCEAN) 0.65 % nasal spray   Nasal   Place 1 spray into the nose as needed for congestion.         . traZODone (DESYREL) 50 MG tablet   Oral   Take 100 mg by mouth at bedtime.            BP 101/42  Pulse 119  Temp(Src) 98.4 F (36.9 C) (Oral)  Resp 16  Ht 5' 2.5" (1.588 m)  Wt 145 lb (65.772 kg)  BMI 26.08 kg/m2  SpO2 96%  LMP 08/03/2012  Vital signs normal except tachycardia   Physical Exam  Nursing note and vitals reviewed. Constitutional: She is oriented to person, place, and time. She appears well-developed and well-nourished.  Non-toxic appearance. She does not appear ill. No distress.  HENT:  Head: Normocephalic and atraumatic.  Right Ear: External ear normal.  Left Ear: External ear normal.  Nose: Nose normal. No mucosal edema or rhinorrhea.  Mouth/Throat: Mucous membranes are normal. No dental abscesses or edematous.  Oropharynx clear but tongue is dry.  Eyes: Conjunctivae and EOM are normal. Pupils are equal, round, and reactive to light.  Neck: Normal range of motion and full passive range of motion without pain. Neck supple.  Cardiovascular: Normal rate, regular rhythm  and normal heart sounds.  Exam reveals no gallop and no friction rub.   No murmur  heard. Pulmonary/Chest: Effort normal and breath sounds normal. No respiratory distress. She has no wheezes. She has no rhonchi. She has no rales. She exhibits no tenderness and no crepitus.  Abdominal: Soft. Normal appearance and bowel sounds are normal. She exhibits no distension. There is tenderness. There is no rebound and no guarding.  Mild diffuse tenderness of the lower abdomen  Musculoskeletal: Normal range of motion. She exhibits no edema and no tenderness.  Moves all extremities well.   Neurological: She is alert and oriented to person, place, and time. She has normal strength. No cranial nerve deficit.  Skin: Skin is warm, dry and intact. No rash noted. No erythema. No pallor.  Insulin Pump site on right abdomen appears normal without infection.  Psychiatric: Her speech is normal and behavior is normal. Her mood appears not anxious.  Affect flat    ED Course  Procedures (including critical care time)  Medications  insulin regular (NOVOLIN R,HUMULIN R) 1 Units/mL in sodium chloride 0.9 % 100 mL infusion (9.8 Units/hr Intravenous New Bag/Given 08/03/12 1341)  dextrose 50 % solution 25 mL (not administered)  0.9 %  sodium chloride infusion ( Intravenous New Bag/Given 08/03/12 1036)  ondansetron (ZOFRAN) injection 4 mg (4 mg Intravenous Given 08/03/12 1103)  0.9 %  sodium chloride infusion (1,000 mLs Intravenous New Bag/Given 08/03/12 1120)    Followed by  0.9 %  sodium chloride infusion (1,000 mLs Intravenous New Bag/Given 08/03/12 1229)  metoCLOPramide (REGLAN) injection 10 mg (10 mg Intravenous Given 08/03/12 1117)  diphenhydrAMINE (BENADRYL) injection 25 mg (25 mg Intravenous Given 08/03/12 1117)     DIAGNOSTIC STUDIES: Oxygen Saturation is 100% on room air, normal by my interpretation.    COORDINATION OF CARE:  11:10AM - IV fluids, benadryl, reglan, and zofran will be ordered for Redge Gainer.   11:45AM - recheck; CBG was >600; R insulin will be given to Red[facility] Gainer. 1:00PM - she will be admitted to the hospital for DKA.  Patient started on IV fluids, IV nausea medicines. After getting her laboratory results back she was started on a insulin drip to control her diabetic ketoacidosis. Patient is agreeable to being admitted.  12:12 PM Dr. Janna Arch will admit  Results for orders placed during the hospital encounter of 08/03/12  GLUCOSE, CAPILLARY      Result Value Range   Glucose-Capillary >600 (*) 70 - 99 mg/dL  URINALYSIS, ROUTINE W REFLEX MICROSCOPIC      Result Value Range   Color, Urine STRAW (*) YELLOW   APPearance CLEAR  CLEAR   Specific Gravity, Urine 1.015  1.005 - 1.030   pH 5.5  5.0 - 8.0   Glucose, UA >1000 (*) NEGATIVE mg/dL   Hgb urine dipstick NEGATIVE  NEGATIVE   Bilirubin Urine NEGATIVE  NEGATIVE   Ketones, ur >80 (*) NEGATIVE mg/dL   Protein, ur NEGATIVE  NEGATIVE mg/dL   Urobilinogen, UA 0.2  0.0 - 1.0 mg/dL   Nitrite NEGATIVE  NEGATIVE   Leukocytes, UA NEGATIVE  NEGATIVE  PREGNANCY, URINE      Result Value Range   Preg Test, Ur NEGATIVE  NEGATIVE  CBC WITH DIFFERENTIAL      Result Value Range   WBC 17.1 (*) 4.0 - 10.5 K/uL   RBC 4.14  3.87 - 5.11 MIL/uL   Hemoglobin 13.6  12.0 - 15.0 g/dL   HCT 30.8  65.7 - 84.6 %   MCV 98.6  78.0 - 100.0 fL   MCH 32.9  26.0 -  34.0 pg   MCHC 33.3  30.0 - 36.0 g/dL   RDW 08.6  57.8 - 46.9 %   Platelets 303  150 - 400 K/uL   Neutrophils Relative 87 (*) 43 - 77 %   Neutro Abs 14.8 (*) 1.7 - 7.7 K/uL   Lymphocytes Relative 11 (*) 12 - 46 %   Lymphs Abs 1.9  0.7 - 4.0 K/uL   Monocytes Relative 2 (*) 3 - 12 %   Monocytes Absolute 0.4  0.1 - 1.0 K/uL   Eosinophils Relative 0  0 - 5 %   Eosinophils Absolute 0.0  0.0 - 0.7 K/uL   Basophils Relative 0  0 - 1 %   Basophils Absolute 0.0  0.0 - 0.1 K/uL  BASIC METABOLIC PANEL      Result Value Range   Sodium 128 (*) 135 - 145 mEq/L   Potassium 4.6  3.5 - 5.1 mEq/L    Chloride 90 (*) 96 - 112 mEq/L   CO2 16 (*) 19 - 32 mEq/L   Glucose, Bld 681 (*) 70 - 99 mg/dL   BUN 21  6 - 23 mg/dL   Creatinine, Ser 6.29  0.50 - 1.10 mg/dL   Calcium 9.6  8.4 - 52.8 mg/dL   GFR calc non Af Amer 88 (*) >90 mL/min   GFR calc Af Amer >90  >90 mL/min  URINE MICROSCOPIC-ADD ON      Result Value Range   Squamous Epithelial / LPF FEW (*) RARE  GLUCOSE, CAPILLARY      Result Value Range   Glucose-Capillary >600 (*) 70 - 99 mg/dL  GLUCOSE, CAPILLARY      Result Value Range   Glucose-Capillary 549 (*) 70 - 99 mg/dL   Comment 1 Documented in Chart     Comment 2 Notify RN     Laboratory interpretation all normal except diabetic ketoacidosis, hyponatremia, low chloride consistent with dehydration, leukocytosis, glucosuria   1. DKA (diabetic ketoacidoses)   2. Vomiting and diarrhea   3. Dehydration     Plan admission   CRITICAL CARE Performed by: Devoria Albe L   Total critical care time: 31 min   Critical care time was exclusive of separately billable procedures and treating other patients.  Critical care was necessary to treat or prevent imminent or life-threatening deterioration.  Critical care was time spent personally by me on the following activities: development of treatment plan with patient and/or surrogate as well as nursing, discussions with consultants, evaluation of patient's response to treatment, examination of patient, obtaining history from patient or surrogate, ordering and performing treatments and interventions, ordering and review of laboratory studies, ordering and review of radiographic studies, pulse oximetry and re-evaluation of patient's condition.   MDM    I personally performed the services described in this documentation, which was scribed in my presence. The recorded information has been reviewed and considered.  Devoria Albe, MD, [facility]        [name], MD 08/03/12 [phone]

## 2012-08-03 NOTE — Progress Notes (Signed)
Pharmacy Consult: Lovenox for VTE prophylaxis.  Patient Measurements: Height: 5' 2.5" (158.8 cm) Weight: 145 lb (65.772 kg) IBW/kg (Calculated) : 51.25 Body mass index is 26.08 kg/(m^2).   VITALS Filed Vitals:   08/03/12 1349  BP:   Pulse: 115  Temp:   Resp: 19    INR Last Three Days: No results found for this basename: INR,  in the last 72 hours  CBC:    Component Value Date/Time   WBC 17.1* 08/03/2012 1036   RBC 4.14 08/03/2012 1036   HGB 13.6 08/03/2012 1036   HCT 40.8 08/03/2012 1036   PLT 303 08/03/2012 1036   MCV 98.6 08/03/2012 1036   MCH 32.9 08/03/2012 1036   MCHC 33.3 08/03/2012 1036   RDW 13.4 08/03/2012 1036   LYMPHSABS 1.9 08/03/2012 1036   MONOABS 0.4 08/03/2012 1036   EOSABS 0.0 08/03/2012 1036   BASOSABS 0.0 08/03/2012 1036    RENAL FUNCTION: Estimated Creatinine Clearance: 83.5 ml/min (by C-G formula based on Cr of 0.84).  Assessment: Dose stable for age, weight, renal function and indication.  Plan: Sign off.  Mady Gemma, [facility] 08/03/2012 2:39 PM

## 2012-08-03 NOTE — H&P (Signed)
[id number] 

## 2012-08-03 NOTE — ED Notes (Signed)
Pt c/o hyperglycemia and nausea today. Pt has insulin pump-states she has had problems since insulin was changed in October.

## 2012-08-03 NOTE — ED Notes (Signed)
Water given to pt 

## 2012-08-04 LAB — HEPATIC FUNCTION PANEL
ALT: 43 U/L — ABNORMAL HIGH (ref 0–35)
AST: 29 U/L (ref 0–37)
Albumin: 2.8 g/dL — ABNORMAL LOW (ref 3.5–5.2)
Total Protein: 5.7 g/dL — ABNORMAL LOW (ref 6.0–8.3)

## 2012-08-04 LAB — BASIC METABOLIC PANEL
BUN: 14 mg/dL (ref 6–23)
Chloride: 108 mEq/L (ref 96–112)
GFR calc Af Amer: >90 mL/min (ref >90)
Potassium: 3.4 mEq/L — ABNORMAL LOW (ref 3.5–5.1)
Sodium: 138 mEq/L (ref 135–145)

## 2012-08-04 LAB — GLUCOSE, CAPILLARY
Glucose-Capillary: 178 mg/dL — ABNORMAL HIGH (ref 70–99)
Glucose-Capillary: 122 mg/dL — ABNORMAL HIGH (ref 70–99)
Glucose-Capillary: 148 mg/dL — ABNORMAL HIGH (ref 70–99)
Glucose-Capillary: 216 mg/dL — ABNORMAL HIGH (ref 70–99)
Glucose-Capillary: 164 mg/dL — ABNORMAL HIGH (ref 70–99)
Glucose-Capillary: 300 mg/dL — ABNORMAL HIGH (ref 70–99)
Glucose-Capillary: 366 mg/dL — ABNORMAL HIGH (ref 70–99)

## 2012-08-04 MED ORDER — INSULIN ASPART 100 UNIT/ML ~~LOC~~ SOLN
0.0000 [IU] | Freq: Every day | SUBCUTANEOUS | Status: DC
  Administered 2012-08-04: 5 [IU] via SUBCUTANEOUS
  Administered 2012-08-05: 3 [IU] via SUBCUTANEOUS
  Administered 2012-08-06: 2 [IU] via SUBCUTANEOUS

## 2012-08-04 MED ORDER — INSULIN ASPART 100 UNIT/ML ~~LOC~~ SOLN
0.0000 [IU] | SUBCUTANEOUS | Status: DC
  Administered 2012-08-04: 20 [IU] via SUBCUTANEOUS
  Administered 2012-08-04: 11 [IU] via SUBCUTANEOUS

## 2012-08-04 MED ORDER — INSULIN GLARGINE 100 UNIT/ML ~~LOC~~ SOLN
20.0000 [IU] | Freq: Every day | SUBCUTANEOUS | Status: DC
  Administered 2012-08-04 – 2012-08-05 (×2): 20 [IU] via SUBCUTANEOUS

## 2012-08-04 MED ORDER — INSULIN ASPART 100 UNIT/ML ~~LOC~~ SOLN
0.0000 [IU] | Freq: Three times a day (TID) | SUBCUTANEOUS | Status: DC
  Administered 2012-08-04: 9 [IU] via SUBCUTANEOUS
  Administered 2012-08-05: 2 [IU] via SUBCUTANEOUS
  Administered 2012-08-05: 5 [IU] via SUBCUTANEOUS
  Administered 2012-08-05 – 2012-08-06 (×2): 3 [IU] via SUBCUTANEOUS
  Administered 2012-08-06 (×2): 5 [IU] via SUBCUTANEOUS

## 2012-08-04 MED ORDER — INSULIN ASPART 100 UNIT/ML ~~LOC~~ SOLN
3.0000 [IU] | Freq: Three times a day (TID) | SUBCUTANEOUS | Status: DC
  Administered 2012-08-04 – 2012-08-07 (×7): 3 [IU] via SUBCUTANEOUS

## 2012-08-04 MED ORDER — DEXTROSE 5 % IV SOLN
1.0000 g | INTRAVENOUS | Status: DC
  Administered 2012-08-04 – 2012-08-06 (×3): 1 g via INTRAVENOUS
  Filled 2012-08-04 (×5): qty 10

## 2012-08-04 NOTE — Progress Notes (Addendum)
Inpatient Diabetes Program Recommendations  AACE/ADA: New Consensus Statement on Inpatient Glycemic Control (2013)  Target Ranges:  Prepandial:   less than 140 mg/dL      Peak postprandial:   less than 180 mg/dL (1-2 hours)      Critically ill patients:  140 - 180 mg/dL   Results for [name], [name] (MRN [id number]) as of 08/04/2012 15:47  Ref. Range 08/04/2012 07:37 08/04/2012 08:40 08/04/2012 09:36 08/04/2012 10:47 08/04/2012 11:32  Glucose-Capillary Latest Range: 70-99 mg/dL 132 (H) 440 (H) 102 (H) 300 (H) 292 (H)     Note: Talked with patient about diabetes and discussed insulin pump insulin settings.  Patient reports that she see Dr. Evlyn Kanner (Endocrinologist) in [place] for diabetes management.  Patient has stated that she is on an Accucheck insulin pump and has been since October 2013 and that her blood glucose has not been under good control since then.  Patient feels that her insulin pump is not delivering basal insulin correctly.  Patient is not able to operate insulin pump to retrieve insulin pump settings.  Patient also informed me that she has a meter with bluetooth capabilities but it was not working correctly and that she usually had to enter her glucose manually into her pump.  Advised patient to call [phone] number on the back of her pump to trouble shoot the pump and ensure that it is working correctly.  Patient has called and made an appointment with an insulin pump certified educator at Dr. Rinaldo Cloud office for Wednesday @2 :30.  I called Dr. Rinaldo Cloud office and spoke with [place] (insulin pump educator) and obtained patient's pump settings and verified that the patient has an appointment for this Wednesday.  According to [place], the only basal setting they have in the chart is 0.8 units/hr at 12:00am; therefore, 0.8 units x 24 hours = 19.2 units/24 hours; sensitivity is 1 unit drops 50 mg/dl; and insulin to carbohydrate ratio is 1 unit for 15 carbs.  Prior to being on an insulin pump, the  patient was taking Lantus 24 units daily and a sliding scale of Humalog.  At this time patient does not want to resume the insulin pump until after she meets with Turkey at Dr. Rinaldo Cloud office on Wednesday.    Based on the information obtained from Dr. Rinaldo Cloud office, it is recommended that the patient be started on Lantus 20 units daily, Novolog sensitive correction, and Novolog 3 units TID with meals for meal coverage.  Thanks, Orlando Penner, RN, BSN, CCRN Diabetes Coordinator Inpatient Diabetes Program [phone]  12/17@4 :46pm - Patient prefers to take Lantus at bedtime because she feels that it works better for her when given at night versus in the morning.  Therefore, after talking with Dr. Sudie Bailey patient will be placed on Lantus 20 units QHS, Novolog sensitive correction, and Novolog 3 units TID with meals for inpatient glycemic control.  Will continue to follow.  Thanks, Orlando Penner, RN, BSN, CCRN Diabetes Coordinator Inpatient Diabetes Program [phone]

## 2012-08-04 NOTE — Progress Notes (Signed)
[name], [name]                 ACCOUNT NO.:  [id number]  MEDICAL RECORD NO.:  [id number]  LOCATION:  IC04                          FACILITY:  [facility]  PHYSICIAN:  Mila Homer. Sudie Bailey, M.D.DATE OF BIRTH:  08-Sep-1975  DATE OF PROCEDURE: DATE OF DISCHARGE:                                PROGRESS NOTE   SUBJECTIVE:  The patient was admitted to the hospital yesterday with diabetic ketoacidosis.  Apparently for the last week she has had really trouble controlling her sugars.  She uses a pump at home.  This may be malfunctioning.  She has also had fever, chills and some low back pain for at least a week or more.  OBJECTIVE:  She is now in the ICU after being in the Glucommander protocol.  VITAL SIGNS:  Temperature 98.4, pulse 70, blood pressure 121/77, O2 sats 95%. GENERAL:  She is supine in bed.  She is alert and oriented.  She is well- developed, well-nourished.  She is in no acute distress. HEART:  Her heart has a regular rhythm, rate of about 80 on my exam. LUNGS:  The lungs are clear throughout.  She is moving air well.  Her admission white blood cell count was 17,100 of which 87% were neutrophils.  Her urinalysis shows specific gravity 1.015, glucose greater than 1000, large hemoglobin, 3-6 WBCs, 11-20 RBCs per HPF.  The patient had polyuria due to the diabetes.  ASSESSMENT: 1. Presumptive urinary tract infection. 2. Diabetic ketoacidosis. 3. Pump malfunction.  PLAN:  She will be on sliding scale insulin in the hospital.  Urine culture is pending.  She will be started on antibiotics.    Mila Homer. Sudie Bailey, M.D.    SDK/MEDQ  D:  08/04/2012  T:  08/04/2012  Job:  [id number]

## 2012-08-04 NOTE — Plan of Care (Signed)
Problem: Phase I Progression Outcomes Goal: Acidosis resolving Outcome: Completed/Met Date Met:  08/04/12 Anion Gap closed  Goal: NPO or per MD order Outcome: Not Progressing Carb coverage for meals

## 2012-08-04 NOTE — Care Management Note (Signed)
    Page 1 of 1   08/07/2012     9:09:10 AM   CARE MANAGEMENT NOTE 08/07/2012  Patient:  [name], [name]   Account Number:  [id number]  Date Initiated:  08/04/2012  Documentation initiated by:  Sharrie Rothman  Subjective/Objective Assessment:   Pt admitted from home with DKA. Pt lives with her daughter and significant other and will return home at discharge. Pt is independent with ADL's. Pt stated that she is able to get meds with her Medicaid.     Action/Plan:   No CM or HH needs noted.   Anticipated DC Date:  08/06/2012   Anticipated DC Plan:  HOME/SELF CARE      DC Planning Services  CM consult      Choice offered to / List presented to:             Status of service:  Completed, signed off Medicare Important Message given?   (If response is "NO", the following Medicare IM given date fields will be blank) Date Medicare IM given:   Date Additional Medicare IM given:    Discharge Disposition:  HOME/SELF CARE  Per UR Regulation:    If discussed at Long Length of Stay Meetings, dates discussed:    Comments:  08/07/12 0907 Arlyss Queen, RN BSN CM Pt discharged home today. Pt to report to endocrinologist office in [facility] for new glucose meter. No CM or HH needs noted.  08/05/12 1515 Arlyss Queen, RN BSN CM Pt very upset about glucose meter missing. Risk management is involved and investigating the complaint. CM did explain to pt that she could buy another meter at [facility] but that the hospital may not cover the meter that she states is missing. CM contacted pts endocrinologist office and spoke to Turkey, pts DM educator. Turkey stated that they would give her a meter at discharge, she will have to go to the office and pick it up. [place] stated that she would contact the pt by phone and notify her that they will provide pt with a meter. CM will follow up at discharge.  08/04/12 1445 Arlyss Queen, RN BSN CM

## 2012-08-05 LAB — GLUCOSE, CAPILLARY
Glucose-Capillary: 220 mg/dL — ABNORMAL HIGH (ref 70–99)
Glucose-Capillary: 177 mg/dL — ABNORMAL HIGH (ref 70–99)
Glucose-Capillary: 265 mg/dL — ABNORMAL HIGH (ref 70–99)

## 2012-08-05 LAB — HEPATIC FUNCTION PANEL
ALT: 95 U/L — ABNORMAL HIGH (ref 0–35)
AST: 125 U/L — ABNORMAL HIGH (ref 0–37)
Alkaline Phosphatase: 139 U/L — ABNORMAL HIGH (ref 39–117)
Total Protein: 5.9 g/dL — ABNORMAL LOW (ref 6.0–8.3)

## 2012-08-05 LAB — BASIC METABOLIC PANEL
BUN: 10 mg/dL (ref 6–23)
CO2: 23 mEq/L (ref 19–32)
Chloride: 105 mEq/L (ref 96–112)
Creatinine, Ser: 0.59 mg/dL (ref 0.50–1.10)
GFR calc Af Amer: >90 mL/min (ref >90)
Potassium: 3.6 mEq/L (ref 3.5–5.1)

## 2012-08-05 LAB — URINE CULTURE: Colony Count: NO GROWTH

## 2012-08-05 MED ORDER — OXYCODONE-ACETAMINOPHEN 5-325 MG PO TABS
1.0000 | ORAL_TABLET | Freq: Three times a day (TID) | ORAL | Status: DC
  Administered 2012-08-05 – 2012-08-07 (×8): 1 via ORAL
  Filled 2012-08-05 (×8): qty 1

## 2012-08-05 MED ORDER — ALPRAZOLAM 1 MG PO TABS
1.0000 mg | ORAL_TABLET | Freq: Three times a day (TID) | ORAL | Status: DC
  Administered 2012-08-05 – 2012-08-06 (×7): 1 mg via ORAL
  Filled 2012-08-05: qty 1
  Filled 2012-08-05: qty 2
  Filled 2012-08-05 (×6): qty 1

## 2012-08-05 NOTE — Progress Notes (Signed)
UR Chart Review Completed  

## 2012-08-05 NOTE — Progress Notes (Signed)
[name], [name]                 ACCOUNT NO.:  [id number]  MEDICAL RECORD NO.:  [id number]  LOCATION:  A337                          FACILITY:  [facility]  PHYSICIAN:  Mila Homer. Sudie Bailey, M.D.DATE OF BIRTH:  [birth year]  DATE OF PROCEDURE: DATE OF DISCHARGE:                                PROGRESS NOTE   SUBJECTIVE:  She feels somewhat better.  She tells me the chills have stopped.  She was having fever, chills, and sweats intermittently for about a week or two prior to coming in the hospital.  OBJECTIVE:  Temp 98.5, pulse 18, blood pressure 111/87.  She is supine in bed.  Her lungs are clear throughout.  Her heart has a regular rhythm.  Rate of about 80.  She still has tenderness in both the left CVA in the right CVA, with the tenderness in the left worse than the right.  She has some minimal suprapubic tenderness.  The BMP today was essentially normal except for glucose of 248.  The alk phos was elevated at 139, AST 125, ALT 95.  A CBC is pending.  ASSESSMENT: 1. Presumptive pyelonephritis. 2. Diabetic ketoacidosis, now stable on sliding scale insulin. 3. Pump malfunction. 4. Chronic low back pain. 5. Chronic anxiety.  PLAN:  She will study in on IV antibiotics and fluids at present.  I am switching her from the ICU to the medical-surgical floor.  I am increasing her alprazolam from 1 mg t.i.d. to 1 mg q.i.d. and putting this on a t.i.d. with meals and bedtime schedule, as well as oxycodone/APAP 5/325 with meals and bedtime for her chronic low back pain.  Once the urine culture is back and we are sure that we are treating the right bacteria appropriately, she may be able to be discharged, but this may be several days from now given her diabetes.     Mila Homer. Sudie Bailey, M.D.     SDK/MEDQ  D:  08/05/2012  T:  08/05/2012  Job:  [id number]

## 2012-08-05 NOTE — Progress Notes (Signed)
Dr. Sudie Bailey paged regarding patient c/o "I'm not getting the pain meds I take at home."  Awaiting return page at this time.

## 2012-08-05 NOTE — Progress Notes (Signed)
Inpatient Diabetes Program Recommendations  AACE/ADA: New Consensus Statement on Inpatient Glycemic Control (2013)  Target Ranges:  Prepandial:   less than 140 mg/dL      Peak postprandial:   less than 180 mg/dL (1-2 hours)      Critically ill patients:  140 - 180 mg/dL   Results for TONNIA, BARDIN (MRN [id number]) as of 08/05/2012 07:30  Ref. Range 08/04/2012 09:36 08/04/2012 10:47 08/04/2012 11:32 08/04/2012 16:31 08/04/2012 21:32  Glucose-Capillary Latest Range: 70-99 mg/dL 469 (H) 629 (H) 528 (H) 366 (H) 392 (H)  Results for XOCHILT, CONANT (MRN [id number]) as of 08/05/2012 07:30  Ref. Range 08/05/2012 04:51  Glucose Latest Range: 70-99 mg/dL 272 (H)     Note: Patient transitioned off of the insulin drip yesterday and was transitioned to subcutaneous insulin.  Blood glucose at 16:31 was 366 mg/dl and 536 mg/dl at 64:40.  Elevated glucose post-insulin drip likely due to the fact that the patient was not given basal insulin at the time of transition from IV to SQ.  Once an order was obtained for basal insulin, patient requested to wait until bedtime to take it because  she reports that it works better for her when it is taken at bedtime.  Therefore, basal insulin was given at bedtime (Lantus 20 units @ 22:06) along with Novolog correction.  Fasting lab blood glucose this morning was 248 mg/dl.   Please consider increasing Lantus to 24 units QHS.  Also, may want to consider increasing sliding scale to moderate scale.  Will continue to follow.  Thanks, Orlando Penner, RN, BSN, CCRN Diabetes Coordinator Inpatient Diabetes Program [phone]

## 2012-08-06 ENCOUNTER — Inpatient Hospital Stay (HOSPITAL_COMMUNITY): Payer: Medicaid Other

## 2012-08-06 LAB — GLUCOSE, CAPILLARY
Glucose-Capillary: 251 mg/dL — ABNORMAL HIGH (ref 70–99)
Glucose-Capillary: 283 mg/dL — ABNORMAL HIGH (ref 70–99)
Glucose-Capillary: 248 mg/dL — ABNORMAL HIGH (ref 70–99)

## 2012-08-06 LAB — BASIC METABOLIC PANEL
BUN: 11 mg/dL (ref 6–23)
CO2: 26 mEq/L (ref 19–32)
Calcium: 8.6 mg/dL (ref 8.4–10.5)
Creatinine, Ser: 0.64 mg/dL (ref 0.50–1.10)
GFR calc non Af Amer: >90 mL/min (ref >90)
Glucose, Bld: 234 mg/dL — ABNORMAL HIGH (ref 70–99)
Sodium: 137 mEq/L (ref 135–145)

## 2012-08-06 LAB — HEPATIC FUNCTION PANEL
Alkaline Phosphatase: 145 U/L — ABNORMAL HIGH (ref 39–117)
Bilirubin, Direct: <0.1 mg/dL (ref 0.0–0.3)
Total Bilirubin: 0.2 mg/dL — ABNORMAL LOW (ref 0.3–1.2)

## 2012-08-06 MED ORDER — INSULIN GLARGINE 100 UNIT/ML ~~LOC~~ SOLN
25.0000 [IU] | Freq: Every day | SUBCUTANEOUS | Status: DC
  Administered 2012-08-06: 25 [IU] via SUBCUTANEOUS

## 2012-08-06 NOTE — Progress Notes (Addendum)
Inpatient Diabetes Program Recommendations  AACE/ADA: New Consensus Statement on Inpatient Glycemic Control (2013)  Target Ranges:  Prepandial:   less than 140 mg/dL      Peak postprandial:   less than 180 mg/dL (1-2 hours)      Critically ill patients:  140 - 180 mg/dL   Reason for Visit: Results for [name], [name] (MRN [id number]) as of 08/06/2012 15:38  Ref. Range 08/05/2012 20:01 08/05/2012 21:06 08/06/2012 06:09 08/06/2012 07:38 08/06/2012 12:11  Glucose-Capillary Latest Range: 70-99 mg/dL 409 (H) 811 (H)  914 (H) 283 (H)   Patient continues to have elevated CBG's.  She is not on her insulin pump at this time.  Consider increasing Lantus to 25 units q HS.  Also may want to increase Novolog meal coverage to 4 units tid with meals (hold if patient eats less than 50%).  Paged Dr. Sudie Bailey to discuss.     1545  Orders received to increase Lantus to 25 units daily.

## 2012-08-06 NOTE — Progress Notes (Signed)
NAMELATREASE, KUNDE                 ACCOUNT NO.:  [id number]  MEDICAL RECORD NO.:  [id number]  LOCATION:  A337                          FACILITY:  [facility]  PHYSICIAN:  Mila Homer. Sudie Bailey, M.D.DATE OF BIRTH:  01/15/1976  DATE OF PROCEDURE: DATE OF DISCHARGE:                                PROGRESS NOTE   SUBJECTIVE:  She is feeling better.  She really wants to go home.  OBJECTIVE:  VITAL SIGNS:  Temperature is 97.8, pulse 64, respiratory rate 18, blood pressure 149/84. She is walking in the room when I come in.  Color is good and energy level looks good. She still does have tenderness on palpation of the CVAs and some of the suprapubic region. HEART:  Has a regular rhythm rate of 70. LUNGS:  Clear throughout.  She is moving air well.  Glucose is 234 with an alk phos 145, albumin 2.9, AST 73, ALT 93.  At this point her urine culture has shown no growth, and that was felt to be a final report.  ASSESSMENT: 1. Presumptive urinary tract infection even with a negative culture. 2. Diabetic ketoacidosis, controlled. 3. Pump malfunction. 4. Chronic back pain. 5. Anxiety.  PLAN:  I am getting a renal ultrasound on her today.  I reviewed her abdominal CT and pelvic CT scan done in, I think, September 2013, which was essentially negative.  She is concerned because the strips for her monitor, which she brought with her to the hospital, are missing, along with some personal items. We discussed this.  If she is doing well tomorrow.  I will discharge her either on Rocephin through home health up at the hospital or on a cephalosporin p.o.     Mila Homer. Sudie Bailey, M.D.     SDK/MEDQ  D:  08/06/2012  T:  08/06/2012  Job:  [id number]

## 2012-08-07 MED ORDER — CEFUROXIME AXETIL 250 MG PO TABS: 500.0000 mg | ORAL_TABLET | Freq: Two times a day (BID) | ORAL | Status: DC

## 2012-08-07 NOTE — Progress Notes (Signed)
Pt verbalizes understanding of d/c instructions and follow up visit today with her endocrinologist, and prescriptions. No questions at this time. IV d/c without complications. Pt ambulated herself with her husband.[name]

## 2012-08-07 NOTE — Discharge Summary (Signed)
NAMECRYSTIN, [name]                 ACCOUNT NO.:  [id number]  MEDICAL RECORD NO.:  [id number]  LOCATION:  A337                          FACILITY:  [facility]  PHYSICIAN:  Mila Homer. Sudie Bailey, M.D.DATE OF BIRTH:  22-Sep-1975  DATE OF ADMISSION:  08/03/2012 DATE OF DISCHARGE:  LH                              DISCHARGE SUMMARY   This 37 year old was admitted to the hospital with uncontrolled diabetes and presumptive pyelonephritis.  She had a 5 day hospitalization extending from February 16 to August 07, 2012.  Her admission white cell count was 7100, of which 87% were neutrophils. Her admission sodium was 128, with chloride 90, bicarb 16 and a glucose of 681.  BMPs were followed closely during the hospitalization.  MRSA by PCR was negative.  Urine culture showed no growth at 2 days.  Her ultrasound of the abdomen was negative.  She was admitted to the ICU and treated with Glucommander protocol until her glucose was acceptable and then put on sliding scale insulin.  Felt better with this but still had symptoms of chills, fever, and back pain. For this reason a urine culture was done and she was started on ceftriaxone 1g IV q.24 hours.  Within several days she felt much better. Back pain was improved. The chills and fever had cleared and the medicine was continued despite a negative urine C and S.  By the 5th day she was much improved.  Her sugars were stable and she was ready for discharge home.  She was to see Dr. Evlyn Kanner, her endocrinologist, in the afternoon and hopefully to get a new insulin pump since her prior insulin pump had been malfunctioning.  She was to continue with her medications of: 1. Alprazolam 1 mg q.i.d. 2. Amphetamine/dextroamphetamine 20 mg b.i.d. 3. Aspirin with acetaminophen p.r.n. 4. Gabapentin 300 mg t.i.d. 5. Ibuprofen 800 mg t.i.d. 6. Magnesium sulfate as needed. 7. Mirtazapine 30 mg q.h.s. 8. Oxycodone/APAP 10/325 q.4 hours p.r.n. pain. 9. Sodium  chloride nasal spray. 10.Trazodone 50 mg, 2 q.h.s. and in addition she will be on cefuroxime     500 mg b.i.d. for a 10-day course (20 with a refill).  FINAL DISCHARGE DIAGNOSES: 1. Presumptive urinary tract infection. 2. Diabetic ketoacidosis with diabetes now well controlled. 3. Chronic anxiety. 4. Fibromyalgia. 5. Chronic back pain. 6. Panic disorder.  FOLLOWUP:  Will be with me in the office next week.     Mila Homer. Sudie Bailey, M.D.     SDK/MEDQ  D:  08/07/2012  T:  08/07/2012  Job:  [id number]

## 2012-08-07 NOTE — Plan of Care (Signed)
Problem: Discharge Progression Outcomes Goal: Obtain signed CBG meter Rx form Pt has insulin pump and is picking up meter at office today

## 2013-05-19 ENCOUNTER — Encounter (HOSPITAL_COMMUNITY): Payer: Self-pay | Admitting: Emergency Medicine

## 2013-05-19 ENCOUNTER — Inpatient Hospital Stay (HOSPITAL_COMMUNITY)
Admission: EM | Admit: 2013-05-19 | Discharge: 2013-05-22 | DRG: 639 | Disposition: A | Discharge status: Home / Self Care | Payer: Medicaid Other | Attending: Family Medicine | Admitting: Family Medicine

## 2013-05-19 ENCOUNTER — Emergency Department (HOSPITAL_COMMUNITY): Payer: Medicaid Other

## 2013-05-19 DIAGNOSIS — Z9641 Presence of insulin pump (external) (internal): Secondary | ICD-10-CM

## 2013-05-19 DIAGNOSIS — R1032 Left lower quadrant pain: Secondary | ICD-10-CM | POA: Diagnosis present

## 2013-05-19 DIAGNOSIS — M51379 Other intervertebral disc degeneration, lumbosacral region without mention of lumbar back pain or lower extremity pain: Secondary | ICD-10-CM | POA: Diagnosis present

## 2013-05-19 DIAGNOSIS — E1010 Type 1 diabetes mellitus with ketoacidosis without coma: Principal | ICD-10-CM | POA: Diagnosis present

## 2013-05-19 DIAGNOSIS — F17200 Nicotine dependence, unspecified, uncomplicated: Secondary | ICD-10-CM | POA: Diagnosis present

## 2013-05-19 DIAGNOSIS — Z833 Family history of diabetes mellitus: Secondary | ICD-10-CM

## 2013-05-19 DIAGNOSIS — IMO0001 Reserved for inherently not codable concepts without codable children: Secondary | ICD-10-CM | POA: Diagnosis present

## 2013-05-19 DIAGNOSIS — G8929 Other chronic pain: Secondary | ICD-10-CM | POA: Diagnosis present

## 2013-05-19 DIAGNOSIS — E1110 Type 2 diabetes mellitus with ketoacidosis without coma: Secondary | ICD-10-CM | POA: Diagnosis present

## 2013-05-19 DIAGNOSIS — Z794 Long term (current) use of insulin: Secondary | ICD-10-CM

## 2013-05-19 DIAGNOSIS — F410 Panic disorder [episodic paroxysmal anxiety] without agoraphobia: Secondary | ICD-10-CM | POA: Diagnosis present

## 2013-05-19 DIAGNOSIS — Z8249 Family history of ischemic heart disease and other diseases of the circulatory system: Secondary | ICD-10-CM

## 2013-05-19 DIAGNOSIS — M5137 Other intervertebral disc degeneration, lumbosacral region: Secondary | ICD-10-CM | POA: Diagnosis present

## 2013-05-19 DIAGNOSIS — K37 Unspecified appendicitis: Secondary | ICD-10-CM

## 2013-05-19 LAB — CBC
HCT: 45.4 % (ref 36.0–46.0)
Hemoglobin: 14.4 g/dL (ref 12.0–15.0)
MCH: 33.9 pg (ref 26.0–34.0)
MCHC: 31.7 g/dL (ref 30.0–36.0)
MCV: 106.8 fL — ABNORMAL HIGH (ref 78.0–100.0)
RBC: 4.25 MIL/uL (ref 3.87–5.11)

## 2013-05-19 LAB — BASIC METABOLIC PANEL
BUN: 31 mg/dL — ABNORMAL HIGH (ref 6–23)
CO2: 19 mEq/L (ref 19–32)
Calcium: 8.5 mg/dL (ref 8.4–10.5)
Creatinine, Ser: 1.15 mg/dL — ABNORMAL HIGH (ref 0.50–1.10)
GFR calc Af Amer: 70 mL/min — ABNORMAL LOW (ref >90)
GFR calc non Af Amer: 60 mL/min — ABNORMAL LOW (ref >90)
Glucose, Bld: 344 mg/dL — ABNORMAL HIGH (ref 70–99)
Sodium: 131 mEq/L — ABNORMAL LOW (ref 135–145)

## 2013-05-19 LAB — URINALYSIS, ROUTINE W REFLEX MICROSCOPIC
Bilirubin Urine: NEGATIVE
Nitrite: NEGATIVE
Protein, ur: NEGATIVE mg/dL
Specific Gravity, Urine: 1.02 (ref 1.005–1.030)
Urobilinogen, UA: 0.2 mg/dL (ref 0.0–1.0)

## 2013-05-19 LAB — GLUCOSE, CAPILLARY
Glucose-Capillary: 586 mg/dL (ref 70–99)
Glucose-Capillary: 454 mg/dL — ABNORMAL HIGH (ref 70–99)
Glucose-Capillary: 232 mg/dL — ABNORMAL HIGH (ref 70–99)
Glucose-Capillary: 178 mg/dL — ABNORMAL HIGH (ref 70–99)
Glucose-Capillary: 135 mg/dL — ABNORMAL HIGH (ref 70–99)
Glucose-Capillary: 137 mg/dL — ABNORMAL HIGH (ref 70–99)
Glucose-Capillary: 141 mg/dL — ABNORMAL HIGH (ref 70–99)

## 2013-05-19 LAB — DIFFERENTIAL
Basophils Absolute: 0 10*3/uL (ref 0.0–0.1)
Lymphocytes Relative: 6 % — ABNORMAL LOW (ref 12–46)
Monocytes Absolute: 2 10*3/uL — ABNORMAL HIGH (ref 0.1–1.0)
Monocytes Relative: 7 % (ref 3–12)
Neutro Abs: 24.3 10*3/uL — ABNORMAL HIGH (ref 1.7–7.7)
Neutrophils Relative %: 87 % — ABNORMAL HIGH (ref 43–77)

## 2013-05-19 LAB — COMPREHENSIVE METABOLIC PANEL
ALT: 15 U/L (ref 0–35)
BUN: 36 mg/dL — ABNORMAL HIGH (ref 6–23)
CO2: 9 mEq/L — CL (ref 19–32)
Calcium: 9.7 mg/dL (ref 8.4–10.5)
GFR calc Af Amer: 56 mL/min — ABNORMAL LOW (ref >90)
GFR calc non Af Amer: 49 mL/min — ABNORMAL LOW (ref >90)
Glucose, Bld: 934 mg/dL (ref 70–99)
Sodium: 128 mEq/L — ABNORMAL LOW (ref 135–145)
Total Protein: 8.3 g/dL (ref 6.0–8.3)

## 2013-05-19 LAB — CORTISOL: Cortisol, Plasma: 72.2 ug/dL

## 2013-05-19 LAB — TROPONIN I: Troponin I: <0.3 ng/mL (ref <0.30)

## 2013-05-19 LAB — TYPE AND SCREEN
ABO/RH(D): O POS
Antibody Screen: NEGATIVE

## 2013-05-19 LAB — LIPASE, BLOOD: Lipase: 13 U/L (ref 11–59)

## 2013-05-19 LAB — D-DIMER, QUANTITATIVE: D-Dimer, Quant: <0.27 ug/mL-FEU (ref 0.00–0.48)

## 2013-05-19 LAB — URINE MICROSCOPIC-ADD ON

## 2013-05-19 LAB — POCT PREGNANCY, URINE: Preg Test, Ur: NEGATIVE

## 2013-05-19 LAB — PROTIME-INR
INR: 1.2 (ref 0.00–1.49)
Prothrombin Time: 14.9 seconds (ref 11.6–15.2)

## 2013-05-19 MED ORDER — SODIUM CHLORIDE 0.9 % IV SOLN
INTRAVENOUS | Status: DC
  Administered 2013-05-20 – 2013-05-21 (×3): via INTRAVENOUS

## 2013-05-19 MED ORDER — INSULIN REGULAR BOLUS VIA INFUSION
0.0000 [IU] | Freq: Three times a day (TID) | INTRAVENOUS | Status: DC
  Filled 2013-05-19: qty 10

## 2013-05-19 MED ORDER — SODIUM CHLORIDE 0.9 % IV BOLUS (SEPSIS)
1000.0000 mL | Freq: Once | INTRAVENOUS | Status: AC
  Administered 2013-05-19: 1000 mL via INTRAVENOUS

## 2013-05-19 MED ORDER — HYDROMORPHONE HCL PF 1 MG/ML IJ SOLN
1.0000 mg | INTRAMUSCULAR | Status: DC | PRN
  Administered 2013-05-19: 1 mg via INTRAVENOUS
  Filled 2013-05-19: qty 1

## 2013-05-19 MED ORDER — DEXTROSE-NACL 5-0.45 % IV SOLN
INTRAVENOUS | Status: DC
  Administered 2013-05-19: 17:00:00 via INTRAVENOUS

## 2013-05-19 MED ORDER — VANCOMYCIN HCL 500 MG IV SOLR
500.0000 mg | Freq: Two times a day (BID) | INTRAVENOUS | Status: DC
  Administered 2013-05-19 – 2013-05-22 (×5): 500 mg via INTRAVENOUS
  Filled 2013-05-19 (×12): qty 500

## 2013-05-19 MED ORDER — SODIUM CHLORIDE 0.9 % IV BOLUS (SEPSIS): 1000.0000 mL | INTRAVENOUS | Status: DC | PRN

## 2013-05-19 MED ORDER — ONDANSETRON HCL 4 MG/2ML IJ SOLN
4.0000 mg | Freq: Once | INTRAMUSCULAR | Status: AC
  Administered 2013-05-19: 4 mg via INTRAVENOUS
  Filled 2013-05-19: qty 2

## 2013-05-19 MED ORDER — DEXTROSE 5 % IV SOLN
1.0000 g | Freq: Two times a day (BID) | INTRAVENOUS | Status: DC
  Administered 2013-05-20 – 2013-05-22 (×5): 1 g via INTRAVENOUS
  Filled 2013-05-19 (×11): qty 1

## 2013-05-19 MED ORDER — SODIUM CHLORIDE 0.9 % IV SOLN
INTRAVENOUS | Status: DC
  Administered 2013-05-19: 12:00:00 via INTRAVENOUS
  Filled 2013-05-19: qty 1

## 2013-05-19 MED ORDER — IOHEXOL 300 MG/ML  SOLN
80.0000 mL | Freq: Once | INTRAMUSCULAR | Status: AC | PRN
  Administered 2013-05-19: 80 mL via INTRAVENOUS

## 2013-05-19 MED ORDER — MORPHINE SULFATE 4 MG/ML IJ SOLN
4.0000 mg | Freq: Once | INTRAMUSCULAR | Status: AC
  Administered 2013-05-19: 4 mg via INTRAVENOUS
  Filled 2013-05-19: qty 1

## 2013-05-19 MED ORDER — DEXTROSE 50 % IV SOLN: 25.0000 mL | INTRAVENOUS | Status: DC | PRN

## 2013-05-19 MED ORDER — HYDROMORPHONE HCL PF 1 MG/ML IJ SOLN
2.0000 mg | INTRAMUSCULAR | Status: DC | PRN
  Administered 2013-05-20 – 2013-05-21 (×7): 2 mg via INTRAVENOUS
  Filled 2013-05-19 (×7): qty 2

## 2013-05-19 MED ORDER — IOHEXOL 300 MG/ML  SOLN
50.0000 mL | Freq: Once | INTRAMUSCULAR | Status: AC | PRN
  Administered 2013-05-19: 50 mL via ORAL

## 2013-05-19 MED ORDER — VANCOMYCIN HCL IN DEXTROSE 1-5 GM/200ML-% IV SOLN
1000.0000 mg | Freq: Once | INTRAVENOUS | Status: AC
  Administered 2013-05-19: 1000 mg via INTRAVENOUS
  Filled 2013-05-19: qty 200

## 2013-05-19 MED ORDER — INSULIN GLARGINE 100 UNIT/ML ~~LOC~~ SOLN
20.0000 [IU] | Freq: Every day | SUBCUTANEOUS | Status: AC
  Administered 2013-05-19: 20 [IU] via SUBCUTANEOUS
  Filled 2013-05-19: qty 0.2

## 2013-05-19 MED ORDER — INSULIN ASPART 100 UNIT/ML ~~LOC~~ SOLN
0.0000 [IU] | SUBCUTANEOUS | Status: DC
Start: 2013-05-20 — End: 2013-05-20
  Administered 2013-05-20: 8 [IU] via SUBCUTANEOUS
  Administered 2013-05-20: 3 [IU] via SUBCUTANEOUS

## 2013-05-19 MED ORDER — DEXTROSE 5 % IV SOLN
2.0000 g | Freq: Once | INTRAVENOUS | Status: AC
  Administered 2013-05-19: 2 g via INTRAVENOUS

## 2013-05-19 MED ORDER — ONDANSETRON HCL 4 MG/2ML IJ SOLN: 4.0000 mg | Freq: Three times a day (TID) | INTRAMUSCULAR | Status: AC | PRN

## 2013-05-19 MED ORDER — PIPERACILLIN-TAZOBACTAM 3.375 G IVPB 30 MIN: 3.3750 g | Freq: Once | INTRAVENOUS | Status: DC

## 2013-05-19 MED ORDER — HYDROMORPHONE HCL PF 1 MG/ML IJ SOLN
1.0000 mg | Freq: Once | INTRAMUSCULAR | Status: AC
  Administered 2013-05-19: 1 mg via INTRAVENOUS
  Filled 2013-05-19: qty 1

## 2013-05-19 MED ORDER — LORAZEPAM 2 MG/ML IJ SOLN
1.0000 mg | Freq: Once | INTRAMUSCULAR | Status: AC
  Administered 2013-05-19: 1 mg via INTRAVENOUS
  Filled 2013-05-19: qty 1

## 2013-05-19 NOTE — ED Notes (Signed)
Complain of n/v for three days. States her blood sugar is high today. Also, complain of pain in right side when taking a deep breath. zofran 4 mg IV given by EMS prior to arrival

## 2013-05-19 NOTE — ED Notes (Signed)
CRITICAL VALUE ALERT  Critical value received:  CO2 9, Glucose 934  Date of notification:  05/19/13  Time of notification:  1153  Critical value read back:yes  Nurse who received alert:  GM  MD notified (1st page):  RA   Time of first page:  1152  MD notified (2nd page):  Time of second page:  Responding MD:  RA  Time MD responded:  1152

## 2013-05-19 NOTE — ED Notes (Signed)
Pt c/o dry mouth, given damp washcloth, family member at bedsdie.

## 2013-05-19 NOTE — ED Provider Notes (Signed)
Medical screening examination/treatment/procedure(s) were conducted as a shared visit with non-physician practitioner(s) and myself.  I personally evaluated the patient during the encounter.  EKG Interpretation    Date/Time:  Tuesday May 19 2013 10:50:09 EST Ventricular Rate:  119 PR Interval:  136 QRS Duration: 110 QT Interval:  332 QTC Calculation: 467 R Axis:   68 Text Interpretation:  Sinus tachycardia Possible Left atrial enlargement Incomplete right bundle branch block Borderline ECG When compared with ECG of 25-Jul-2011 13:32, Incomplete right bundle branch block is now Present Confirmed by Nayelly Laughman  DO, [place] [id number]) on 05/19/2013 11:13:47 AM            Patient is a 37 year old female with a history of type 1 diabetes on insulin pump who presents emergency department with one week of hyperglycemia, vomiting and abdominal pain.  States she could not afford her insulin she has not been using her pump for the past several days. She states she's had chills but no fever. No cough, dysuria or hematuria, diarrhea. Here the ED, patient's blood glucoses greater than 600 and she is tachycardic and mildly tachypneic. She is mentating well and normotensive. Her abdomen is mildly tender to palpation diffusely, worse in RLQ. Her labs show a leukocytosis of 29.3, lactate of 8.9, hypertension with potassium of 6.0. Her anion gap is 38.  Patient in DKA. She's been given 3 L of IV fluids and insulin drip has been started. She has a negative urinalysis and chest x-ray. Cultures pending. Patient has received IV vancomycin and cefepime for possible sepsis. CT abdomen pelvis pending. Patient will need admission.   CT scan shows prominence of the periappendical fat without definite inflammatory changes the appendix itself. Given patient is more tender to palpation in this area and has a leukocytosis of 29 with left shift, we'll discuss with surgery.  Likely medicine admit.  Elizabeth Maw Oliverio Cho, DO 05/19/13  1446

## 2013-05-19 NOTE — Progress Notes (Signed)
ANTIBIOTIC CONSULT NOTE - INITIAL  Pharmacy Consult for Vancomycin and Cefepime Indication: sepsis  Allergies  Allergen Reactions  . Penicillins Rash    As a child  . Levofloxacin Swelling    Patient Measurements: Height: 5\' 2"  (157.5 cm) Weight: 150 lb (68.04 kg) IBW/kg (Calculated) : 50.1  Vital Signs: Temp: 98.6 F (37 C) (12/02 1100) Temp src: Oral (12/02 1100) BP: 108/54 mmHg (12/02 1608) Pulse Rate: 116 (12/02 1608) Intake/Output from previous day:   Intake/Output from this shift:    Labs:  Recent Labs  05/19/13 1028  WBC 29.3*  HGB 14.4  PLT 362  CREATININE 1.37*   Estimated Creatinine Clearance: 50.9 ml/min (by C-G formula based on Cr of 1.37). No results found for this basename: VANCOTROUGH, VANCOPEAK, VANCORANDOM, GENTTROUGH, GENTPEAK, GENTRANDOM, TOBRATROUGH, TOBRAPEAK, TOBRARND, AMIKACINPEAK, AMIKACINTROU, AMIKACIN,  in the last 72 hours   Microbiology: No results found for this or any previous visit (from the past 720 hour(s)).  Medical History: Past Medical History  Diagnosis Date  . Panic attacks   . Diabetes mellitus   . Suicidal thoughts   . Seizures     diabetic seizures in past-last was few yrs ago  . Fibromyalgia   . DDD (degenerative disc disease), lumbar   . Chronic back pain   . Chronic shoulder pain    Medications:  Scheduled:   Assessment: 37yo female admitted with suspected sepsis.  Pt has elevated SCr on admission.  Estimated Creatinine Clearance: 50.9 ml/min (by C-G formula based on Cr of 1.37).  Goal of Therapy:  Vancomycin trough level 15-20 mcg/ml  Plan:  Vancomycin 1000mg  IV x 1 (loading dose) then Vancomycin 500mg  IV q12hrs Check trough at steady state Cefepime 2gm IV x 1 then 1gm IV q12hrs Monitor labs, renal fxn, and cultures  Valrie Hart A 05/19/2013,4:18 PM

## 2013-05-19 NOTE — ED Notes (Signed)
Pt has asked repeatedly about having po fluids, advised npo for now. That we are attempting to get her bs within a normal range.  Family at bedside. Pt will dose at times.

## 2013-05-19 NOTE — Consult Note (Signed)
Reason for Consult: Possible appendicitis Referring Physician: Dr. Gareth Morgan  [name] is an 37 y.o. female.  HPI: Patient is a 37 year old white female who presented emergency room with crampy lower abdominal pain. During her workup, she was found to have a blood sugar of 900 and in DKA. A CT scan of the abdomen was performed which revealed questionable increased fatty changes around the appendix, though frank appendicitis was not seen. The patient describes having crampy suprapubic pain. It is unknown how long this has been happening.  Past Medical History  Diagnosis Date  . Panic attacks   . Diabetes mellitus   . Suicidal thoughts   . Seizures     diabetic seizures in past-last was few yrs ago  . Fibromyalgia   . DDD (degenerative disc disease), lumbar   . Chronic back pain   . Chronic shoulder pain     Past Surgical History  Procedure Laterality Date  . Cesarean section  1998    x1  . Hernia repair      bilateral inguinal hernia repairs age 33  . Laparoscopic tubal ligation  07/31/2011    Procedure: LAPAROSCOPIC TUBAL LIGATION;  Surgeon: [name], MD;  Location: [facility];  Service: Gynecology;  Laterality: Bilateral;  laparoscopic bilateral tubal ligation with falope rings    Family History  Problem Relation Age of Onset  . Hypotension Neg Hx   . Anesthesia problems Neg Hx   . Malignant hyperthermia Neg Hx   . Pseudochol deficiency Neg Hx   . Heart disease    . Arthritis    . Cancer    . Diabetes      Social History:  reports that she has been smoking Cigarettes.  She has a 10 pack-year smoking history. She does not have any smokeless tobacco history on file. She reports that she does not drink alcohol or use illicit drugs.  Allergies:  Allergies  Allergen Reactions  . Penicillins Rash    As a child  . Levofloxacin Swelling    Medications: I have reviewed the patient's current medications.  Results for orders placed during the hospital encounter  of 05/19/13 (from the past 48 hour(s))  GLUCOSE, CAPILLARY     Status: Abnormal   Collection Time    05/19/13 10:06 AM      Result Value Range   Glucose-Capillary >600 (*) 70 - 99 mg/dL   Comment 1 Documented in Chart     Comment 2 Confirm Test in Lab    URINALYSIS, ROUTINE W REFLEX MICROSCOPIC     Status: Abnormal   Collection Time    05/19/13 10:16 AM      Result Value Range   Color, Urine YELLOW  YELLOW   APPearance CLEAR  CLEAR   Specific Gravity, Urine 1.020  1.005 - 1.030   pH 5.5  5.0 - 8.0   Glucose, UA >1000 (*) NEGATIVE mg/dL   Hgb urine dipstick SMALL (*) NEGATIVE   Bilirubin Urine NEGATIVE  NEGATIVE   Ketones, ur 40 (*) NEGATIVE mg/dL   Protein, ur NEGATIVE  NEGATIVE mg/dL   Urobilinogen, UA 0.2  0.0 - 1.0 mg/dL   Nitrite NEGATIVE  NEGATIVE   Leukocytes, UA NEGATIVE  NEGATIVE  URINE MICROSCOPIC-ADD ON     Status: None   Collection Time    05/19/13 10:16 AM      Result Value Range   Squamous Epithelial / LPF RARE  RARE   WBC, UA 0-2  <3 WBC/hpf  RBC / HPF 0-2  <3 RBC/hpf  POCT PREGNANCY, URINE     Status: None   Collection Time    05/19/13 10:19 AM      Result Value Range   Preg Test, Ur NEGATIVE  NEGATIVE   Comment:            THE SENSITIVITY OF THIS     METHODOLOGY IS >24 mIU/mL  CBC     Status: Abnormal   Collection Time    05/19/13 10:28 AM      Result Value Range   WBC 29.3 (*) 4.0 - 10.5 K/uL   RBC 4.25  3.87 - 5.11 MIL/uL   Hemoglobin 14.4  12.0 - 15.0 g/dL   HCT 16.1  09.6 - 04.5 %   MCV 106.8 (*) 78.0 - 100.0 fL   MCH 33.9  26.0 - 34.0 pg   MCHC 31.7  30.0 - 36.0 g/dL   RDW 40.9  81.1 - 91.4 %   Platelets 362  150 - 400 K/uL  COMPREHENSIVE METABOLIC PANEL     Status: Abnormal   Collection Time    05/19/13 10:28 AM      Result Value Range   Sodium 128 (*) 135 - 145 mEq/L   Potassium 6.0 (*) 3.5 - 5.1 mEq/L   Chloride 81 (*) 96 - 112 mEq/L   CO2 9 (*) 19 - 32 mEq/L   Comment: CRITICAL RESULT CALLED TO, READ BACK BY AND VERIFIED WITH:      WINN,T AT 1150B Y HUFFINES,S ON 05/19/13     CRITICAL RESULT CALLED TO, READ BACK BY AND VERIFIED WITH:     MEADOWS,G AT 1150 BY HUFFINES,S ON 05/19/13   Glucose, Bld 934 (*) 70 - 99 mg/dL   Comment: CRITICAL RESULT CALLED TO, READ BACK BY AND VERIFIED WITH:     MEADOWS,G AT 1150 BY HUFFINES,S ON 05/19/13   BUN 36 (*) 6 - 23 mg/dL   Creatinine, Ser 7.82 (*) 0.50 - 1.10 mg/dL   Calcium 9.7  8.4 - 95.6 mg/dL   Total Protein 8.3  6.0 - 8.3 g/dL   Albumin 4.3  3.5 - 5.2 g/dL   AST 16  0 - 37 U/L   ALT 15  0 - 35 U/L   Alkaline Phosphatase 186 (*) 39 - 117 U/L   Total Bilirubin 0.4  0.3 - 1.2 mg/dL   GFR calc non Af Amer 49 (*) >90 mL/min   GFR calc Af Amer 56 (*) >90 mL/min   Comment: (NOTE)     The eGFR has been calculated using the CKD EPI equation.     This calculation has not been validated in all clinical situations.     eGFR's persistently <90 mL/min signify possible Chronic Kidney     Disease.  LIPASE, BLOOD     Status: None   Collection Time    05/19/13 10:28 AM      Result Value Range   Lipase 13  11 - 59 U/L  LACTIC ACID, PLASMA     Status: Abnormal   Collection Time    05/19/13 10:28 AM      Result Value Range   Lactic Acid, Venous 8.9 (*) 0.5 - 2.2 mmol/L  D-DIMER, QUANTITATIVE     Status: None   Collection Time    05/19/13 10:28 AM      Result Value Range   D-Dimer, Quant <0.27  0.00 - 0.48 ug/mL-FEU   Comment:  AT THE INHOUSE ESTABLISHED CUTOFF     VALUE OF 0.48 ug/mL FEU,     THIS ASSAY HAS BEEN DOCUMENTED     IN THE LITERATURE TO HAVE     A SENSITIVITY AND NEGATIVE     PREDICTIVE VALUE OF AT LEAST     98 TO 99%.  THE TEST RESULT     SHOULD BE CORRELATED WITH     AN ASSESSMENT OF THE CLINICAL     PROBABILITY OF DVT / VTE.  TROPONIN I     Status: None   Collection Time    05/19/13 10:28 AM      Result Value Range   Troponin I <0.30  <0.30 ng/mL   Comment:            Due to the release kinetics of cTnI,     a negative result within the first  hours     of the onset of symptoms does not rule out     myocardial infarction with certainty.     If myocardial infarction is still suspected,     repeat the test at appropriate intervals.  PROTIME-INR     Status: None   Collection Time    05/19/13 10:28 AM      Result Value Range   Prothrombin Time 14.9  11.6 - 15.2 seconds   INR 1.20  0.00 - 1.49  APTT     Status: Abnormal   Collection Time    05/19/13 10:28 AM      Result Value Range   aPTT 23 (*) 24 - 37 seconds  FIBRINOGEN     Status: None   Collection Time    05/19/13 10:28 AM      Result Value Range   Fibrinogen 463  204 - 475 mg/dL  TYPE AND SCREEN     Status: None   Collection Time    05/19/13 11:52 AM      Result Value Range   ABO/RH(D) O POS     Antibody Screen NEG     Sample Expiration 05/22/2013    DIFFERENTIAL     Status: Abnormal   Collection Time    05/19/13 12:45 PM      Result Value Range   Neutrophils Relative % 87 (*) 43 - 77 %   Neutro Abs 24.3 (*) 1.7 - 7.7 K/uL   Lymphocytes Relative 6 (*) 12 - 46 %   Lymphs Abs 1.5  0.7 - 4.0 K/uL   Monocytes Relative 7  3 - 12 %   Monocytes Absolute 2.0 (*) 0.1 - 1.0 K/uL   Eosinophils Relative 0  0 - 5 %   Eosinophils Absolute 0.0  0.0 - 0.7 K/uL   Basophils Relative 0  0 - 1 %   Basophils Absolute 0.0  0.0 - 0.1 K/uL   WBC Morphology INCREASED BANDS (>20% BANDS)    BLOOD GAS, VENOUS     Status: Abnormal (Preliminary result)   Collection Time    05/19/13  1:05 PM      Result Value Range   pH, Ven PENDING  7.250 - 7.300   pCO2, Ven PENDING  45.0 - 50.0 mmHg   pO2, Ven PENDING  30.0 - 45.0 mmHg   Bicarbonate 9.9 (*) 20.0 - 24.0 mEq/L   TCO2 9.2  0 - 100 mmol/L   Acid-base deficit 16.4 (*) 0.0 - 2.0 mmol/L   O2 Saturation 95.3     Patient temperature 37.0     Collection  site RIGHT RADIAL     Drawn by COLLECTED BY RT     Sample type ARTERIAL    GLUCOSE, CAPILLARY     Status: Abnormal   Collection Time    05/19/13  1:28 PM      Result Value Range    Glucose-Capillary 586 (*) 70 - 99 mg/dL   Comment 1 Documented in Chart    GLUCOSE, CAPILLARY     Status: Abnormal   Collection Time    05/19/13  2:27 PM      Result Value Range   Glucose-Capillary 454 (*) 70 - 99 mg/dL   Comment 1 Documented in Chart    GLUCOSE, CAPILLARY     Status: Abnormal   Collection Time    05/19/13  3:24 PM      Result Value Range   Glucose-Capillary 369 (*) 70 - 99 mg/dL  BASIC METABOLIC PANEL     Status: Abnormal   Collection Time    05/19/13  4:04 PM      Result Value Range   Sodium 131 (*) 135 - 145 mEq/L   Potassium 3.9  3.5 - 5.1 mEq/L   Comment: DELTA CHECK NOTED   Chloride 96  96 - 112 mEq/L   Comment: DELTA CHECK NOTED   CO2 19  19 - 32 mEq/L   Glucose, Bld 344 (*) 70 - 99 mg/dL   BUN 31 (*) 6 - 23 mg/dL   Creatinine, Ser 1.61 (*) 0.50 - 1.10 mg/dL   Calcium 8.5  8.4 - 09.6 mg/dL   GFR calc non Af Amer 60 (*) >90 mL/min   GFR calc Af Amer 70 (*) >90 mL/min   Comment: (NOTE)     The eGFR has been calculated using the CKD EPI equation.     This calculation has not been validated in all clinical situations.     eGFR's persistently <90 mL/min signify possible Chronic Kidney     Disease.    Dg Chest 2 View  05/19/2013   CLINICAL DATA:  Right-sided back pain, kidney pain.  EXAM: CHEST  2 VIEW  COMPARISON:  None.  FINDINGS: The heart size and mediastinal contours are within normal limits. Both lungs are clear. The visualized skeletal structures are unremarkable.  IMPRESSION: No active cardiopulmonary disease.   Electronically Signed   By: Elige Ko   On: 05/19/2013 11:17   Ct Abdomen Pelvis W Contrast  05/19/2013   CLINICAL DATA:  Elevated white blood cell count, right flank pain and nausea and vomiting  EXAM: CT ABDOMEN AND PELVIS WITH CONTRAST  TECHNIQUE: Multidetector CT imaging of the abdomen and pelvis was performed using the standard protocol following bolus administration of intravenous contrast.  CONTRAST:  43 mL OMNIPAQUE IOHEXOL 300  MG/ML SOLN, the patient also received oral contrast.  COMPARISON:  CT scan of the abdomen and pelvis dated February 29, 2012.  FINDINGS: The liver exhibits no focal mass nor ductal dilation. Mildly decreased density within the liver may reflect fatty infiltration. The gallbladder is adequately distended with no evidence of stones or acute inflammation. The spleen, moderately distended stomach, duodenum, adrenal glands, and pancreas exhibit no acute abnormalities. The kidneys are normal in contour. There is no hydronephrosis. The appearance of the renal parenchyma does not suggest pyelonephritis. No calcified stones are evident. The psoas musculature appears normal in density and contour. The caliber of the abdominal aorta is normal.  The partially contrast filled loops of small and large bowel exhibit no evidence of  ileus nor obstruction. The appendix is demonstrated on images 44 through 56. There is a small amount of contrast within the appendix. There is no discrete appendicolith. The maximal measured diameter of the appendix is 10 mm. There is minimal prominence of the periappendiceal fat near the base of the appendix adjacent to the tip of the cecum on images 44-49. There is no discrete abscess. There is no evidence of a small or large bowel obstruction. There is no evidence of enteritis or colitis or diverticulitis. There is no significant diverticulosis.  The urinary bladder is moderately distended. There are phleboliths within the pelvis. The uterus is retroverted. There is fluid in the endometrial cavity. There is no suspicious adnexal mass. There may be small cysts associated with the left ovary. There is no inguinal nor significant umbilical hernia.  The lung bases exhibit no infiltrates. There is minimal pleural thickening posteriorly, bilaterally. The lumbar vertebral bodies are preserved in height. The bony pelvis appears normal.  IMPRESSION: 1. There is minimal prominence of the periappendiceal fat  without definite inflammatory change of the appendix itself. This is a subtle finding and may not be clinically significant but correlation with patient's clinical exam is needed. There is no evidence of a small or large bowel obstruction or acute inflammation. 2. There is no acute hepatobiliary abnormality. There is no acute urinary tract abnormality. Specifically there are no findings to suggest calcified urinary tract stones nor hydronephrosis. There is no CT evidence of pyelonephritis. 3. Irregular density of the left ovary likely reflects multiple subcentimeter cysts. 4. There is no evidence of an intra-abdominal or pelvic abscess nor lymphadenopathy.   Electronically Signed   By: David  [place]   On: 05/19/2013 14:35    ROS: See chart Blood pressure 108/54, pulse 116, temperature 98.6 F (37 C), temperature source Oral, resp. rate 16, height 5\' 2"  (1.575 m), weight 68.04 kg (150 lb), last menstrual period 05/05/2013, SpO2 95.00%. Physical Exam: Arousable white female in no acute distress. Abdomen is soft and flat. I do not elicit any right lower quadrant abdominal tenderness. She awakened during my examination and stated that her pain is over her bladder. No rigidity is noted.   Assessment/Plan: Impression: DKA, questionable CT findings for appendicitis. Her physical exam does not match the CT findings. I do understand she has a white blood count is significantly elevated, but this is out of proportion to the CT findings. No need for acute surgical intervention at this time. Obviously her DKA is her most pressing need for treatment. We'll follow with you.  Ramonica Grigg A 05/19/2013, 4:47 PM

## 2013-05-19 NOTE — ED Notes (Signed)
Pt alert talking about her dreams with family member.

## 2013-05-19 NOTE — ED Provider Notes (Signed)
CSN: [id number]     Arrival date & time 05/19/13  0957 History   First MD Initiated Contact with Patient 05/19/13 [phone]     Chief Complaint  Patient presents with  . Hyperglycemia   (Consider location/radiation/quality/duration/timing/severity/associated sxs/prior Treatment) HPI Comments: Pt is a 37 y/o female with a PMHx of type 1 diabetes, seizures, chronic back pain, panic attacks and fibromyalgia who presents to the emergency department with her sister complaining of generalized abdominal pain, nausea and vomiting x3 days. States she feels as if her sugar is high, she has been unable to check her sugar level as she cannot afford the test strips at this time. She has not been able to check them for the past few weeks, however has been using her insulin and has an insulin pump. Admits to decreased appetite and a few episodes of diarrhea. States she has chills, no fever. Also complaining of right flank pain, worse with deep inspiration with associated shortness of breath. Pain currently 10/10. Denies increased urinary frequency, urgency or dysuria. Blood sugar on arrival to ED noted to be >600.  The history is provided by the patient and a relative.    Past Medical History  Diagnosis Date  . Panic attacks   . Diabetes mellitus   . Suicidal thoughts   . Seizures     diabetic seizures in past-last was few yrs ago  . Fibromyalgia   . DDD (degenerative disc disease), lumbar   . Chronic back pain   . Chronic shoulder pain    Past Surgical History  Procedure Laterality Date  . Cesarean section  1998    x1  . Hernia repair      bilateral inguinal hernia repairs age 89  . Laparoscopic tubal ligation  07/31/2011    Procedure: LAPAROSCOPIC TUBAL LIGATION;  Surgeon: [name], MD;  Location: [facility];  Service: Gynecology;  Laterality: Bilateral;  laparoscopic bilateral tubal ligation with falope rings   Family History  Problem Relation Age of Onset  . Hypotension Neg Hx   . Anesthesia  problems Neg Hx   . Malignant hyperthermia Neg Hx   . Pseudochol deficiency Neg Hx   . Heart disease    . Arthritis    . Cancer    . Diabetes     History  Substance Use Topics  . Smoking status: Current Every Day Smoker -- 0.50 packs/day for 20 years    Types: Cigarettes  . Smokeless tobacco: Not on file  . Alcohol Use: No     Comment: occasional   OB History   Grav Para Term Preterm Abortions TAB SAB Ect Mult Living                 Review of Systems  Constitutional: Positive for chills and appetite change.  Gastrointestinal: Positive for nausea, vomiting, abdominal pain and diarrhea.  Genitourinary: Positive for flank pain.  All other systems reviewed and are negative.    Allergies  Penicillins and Levofloxacin  Home Medications   Current Outpatient Rx  Name  Route  Sig  Dispense  Refill  . ALPRAZolam (XANAX) 1 MG tablet   Oral   Take 1 mg by mouth 4 (four) times daily.         Marland Kitchen amphetamine-dextroamphetamine (ADDERALL) 20 MG tablet   Oral   Take 20 mg by mouth 2 (two) times daily.           . Aspirin-Acetaminophen (GOODY BODY PAIN) 500-325 MG PACK   Oral  Take 1 Package by mouth every 4 (four) hours as needed (Pain).         . cefUROXime (CEFTIN) 250 MG tablet   Oral   Take 2 tablets (500 mg total) by mouth 2 (two) times daily.   20 tablet   1   . gabapentin (NEURONTIN) 300 MG capsule   Oral   Take 300 mg by mouth 3 (three) times daily.         Marland Kitchen ibuprofen (ADVIL,MOTRIN) 800 MG tablet   Oral   Take 800 mg by mouth 3 (three) times daily.         . Insulin Human (INSULIN PUMP) 100 unit/ml SOLN   Subcutaneous   Inject into the skin continuous. Novolog Insulin         . Magnesium Sulfate, Laxative, (EPSOM SALT PO)   Oral   Take by mouth daily as needed (Constipation).         . mirtazapine (REMERON) 30 MG tablet   Oral   Take 30 mg by mouth at bedtime.         Marland Kitchen oxyCODONE-acetaminophen (PERCOCET) 10-325 MG per tablet   Oral    Take 1 tablet by mouth every 4 (four) hours as needed for pain.         . sodium chloride (OCEAN) 0.65 % nasal spray   Nasal   Place 1 spray into the nose as needed for congestion.         . traZODone (DESYREL) 50 MG tablet   Oral   Take 100 mg by mouth at bedtime.           Temp(Src) 98.6 F (37 C)  Ht 5\' 2"  (1.575 m)  Wt 150 lb (68.04 kg)  BMI 27.43 kg/m2  LMP 05/05/2013 Physical Exam  Nursing note and vitals reviewed. Constitutional: She is oriented to person, place, and time. She appears well-developed and well-nourished. No distress.  HENT:  Head: Normocephalic and atraumatic.  Mouth/Throat: Uvula is midline. Mucous membranes are dry.  Eyes: Conjunctivae and EOM are normal. Pupils are equal, round, and reactive to light. No scleral icterus.  Neck: Normal range of motion. Neck supple.  Cardiovascular: Regular rhythm, normal heart sounds and intact distal pulses.  Tachycardia present.   Pulmonary/Chest: Breath sounds normal. Tachypnea noted. No respiratory distress. She has no decreased breath sounds. She has no wheezes. She has no rhonchi. She has no rales.  Abdominal: Soft. Normal appearance and bowel sounds are normal. She exhibits no distension and no mass. There is generalized tenderness. There is CVA tenderness (right). There is no rigidity, no rebound and no guarding.  Tenderness worse RLQ. No peritoneal signs.  Musculoskeletal: Normal range of motion. She exhibits no edema.  Neurological: She is alert and oriented to person, place, and time.  Skin: Skin is warm and dry. She is not diaphoretic.  Psychiatric: She has a normal mood and affect. Her behavior is normal.    ED Course  Procedures (including critical care time) Labs Review Labs Reviewed  CBC - Abnormal; Notable for the following:    WBC 29.3 (*)    MCV 106.8 (*)    All other components within normal limits  COMPREHENSIVE METABOLIC PANEL - Abnormal; Notable for the following:    Sodium 128 (*)     Potassium 6.0 (*)    Chloride 81 (*)    CO2 9 (*)    Glucose, Bld 934 (*)    BUN 36 (*)    Creatinine, Ser  1.37 (*)    Alkaline Phosphatase 186 (*)    GFR calc non Af Amer 49 (*)    GFR calc Af Amer 56 (*)    All other components within normal limits  URINALYSIS, ROUTINE W REFLEX MICROSCOPIC - Abnormal; Notable for the following:    Glucose, UA >1000 (*)    Hgb urine dipstick SMALL (*)    Ketones, ur 40 (*)    All other components within normal limits  LACTIC ACID, PLASMA - Abnormal; Notable for the following:    Lactic Acid, Venous 8.9 (*)    All other components within normal limits  GLUCOSE, CAPILLARY - Abnormal; Notable for the following:    Glucose-Capillary >600 (*)    All other components within normal limits  APTT - Abnormal; Notable for the following:    aPTT 23 (*)    All other components within normal limits  DIFFERENTIAL - Abnormal; Notable for the following:    Neutrophils Relative % 87 (*)    Neutro Abs 24.3 (*)    Lymphocytes Relative 6 (*)    Monocytes Absolute 2.0 (*)    All other components within normal limits  BLOOD GAS, VENOUS - Abnormal; Notable for the following:    Bicarbonate 9.9 (*)    Acid-base deficit 16.4 (*)    All other components within normal limits  GLUCOSE, CAPILLARY - Abnormal; Notable for the following:    Glucose-Capillary 586 (*)    All other components within normal limits  GLUCOSE, CAPILLARY - Abnormal; Notable for the following:    Glucose-Capillary 454 (*)    All other components within normal limits  CULTURE, BLOOD (ROUTINE X 2)  CULTURE, BLOOD (ROUTINE X 2)  URINE CULTURE  LIPASE, BLOOD  URINE MICROSCOPIC-ADD ON  D-DIMER, QUANTITATIVE  TROPONIN I  PROTIME-INR  FIBRINOGEN  CORTISOL  POCT PREGNANCY, URINE  TYPE AND SCREEN   Imaging Review Dg Chest 2 View  05/19/2013   CLINICAL DATA:  Right-sided back pain, kidney pain.  EXAM: CHEST  2 VIEW  COMPARISON:  None.  FINDINGS: The heart size and mediastinal contours are within  normal limits. Both lungs are clear. The visualized skeletal structures are unremarkable.  IMPRESSION: No active cardiopulmonary disease.   Electronically Signed   By: Elige Ko   On: 05/19/2013 11:17   Ct Abdomen Pelvis W Contrast  05/19/2013   CLINICAL DATA:  Elevated white blood cell count, right flank pain and nausea and vomiting  EXAM: CT ABDOMEN AND PELVIS WITH CONTRAST  TECHNIQUE: Multidetector CT imaging of the abdomen and pelvis was performed using the standard protocol following bolus administration of intravenous contrast.  CONTRAST:  43 mL OMNIPAQUE IOHEXOL 300 MG/ML SOLN, the patient also received oral contrast.  COMPARISON:  CT scan of the abdomen and pelvis dated February 29, 2012.  FINDINGS: The liver exhibits no focal mass nor ductal dilation. Mildly decreased density within the liver may reflect fatty infiltration. The gallbladder is adequately distended with no evidence of stones or acute inflammation. The spleen, moderately distended stomach, duodenum, adrenal glands, and pancreas exhibit no acute abnormalities. The kidneys are normal in contour. There is no hydronephrosis. The appearance of the renal parenchyma does not suggest pyelonephritis. No calcified stones are evident. The psoas musculature appears normal in density and contour. The caliber of the abdominal aorta is normal.  The partially contrast filled loops of small and large bowel exhibit no evidence of ileus nor obstruction. The appendix is demonstrated on images 44 through 56. There  is a small amount of contrast within the appendix. There is no discrete appendicolith. The maximal measured diameter of the appendix is 10 mm. There is minimal prominence of the periappendiceal fat near the base of the appendix adjacent to the tip of the cecum on images 44-49. There is no discrete abscess. There is no evidence of a small or large bowel obstruction. There is no evidence of enteritis or colitis or diverticulitis. There is no  significant diverticulosis.  The urinary bladder is moderately distended. There are phleboliths within the pelvis. The uterus is retroverted. There is fluid in the endometrial cavity. There is no suspicious adnexal mass. There may be small cysts associated with the left ovary. There is no inguinal nor significant umbilical hernia.  The lung bases exhibit no infiltrates. There is minimal pleural thickening posteriorly, bilaterally. The lumbar vertebral bodies are preserved in height. The bony pelvis appears normal.  IMPRESSION: 1. There is minimal prominence of the periappendiceal fat without definite inflammatory change of the appendix itself. This is a subtle finding and may not be clinically significant but correlation with patient's clinical exam is needed. There is no evidence of a small or large bowel obstruction or acute inflammation. 2. There is no acute hepatobiliary abnormality. There is no acute urinary tract abnormality. Specifically there are no findings to suggest calcified urinary tract stones nor hydronephrosis. There is no CT evidence of pyelonephritis. 3. Irregular density of the left ovary likely reflects multiple subcentimeter cysts. 4. There is no evidence of an intra-abdominal or pelvic abscess nor lymphadenopathy.   Electronically Signed   By: David  [place]   On: 05/19/2013 14:35    EKG Interpretation    Date/Time:  Tuesday May 19 2013 10:50:09 EST Ventricular Rate:  119 PR Interval:  136 QRS Duration: 110 QT Interval:  332 QTC Calculation: 467 R Axis:   68 Text Interpretation:  Sinus tachycardia Possible Left atrial enlargement Incomplete right bundle branch block Borderline ECG When compared with ECG of 25-Jul-2011 13:32, Incomplete right bundle branch block is now Present Confirmed by WARD  DO, KRISTEN ([id number]) on 05/19/2013 11:13:47 AM            MDM   1. DKA (diabetic ketoacidoses)   2. Appendicitis     Pt presenting with hyperglycemia, n/v, abdominal and  flank pain. She appears uncomfortable, dry. Receiving fluid bolus. Labs pending- cbc, cmp, lipase, ua, lactate, preg, d-dimer (pleuritic CP, not PERC negative), CXR, EKG. Possibly in DKA. 11:27 AM D-dimer WNL. Leukocytosis of 29.3. Lactic acid 8.9. Code sepsis initiated. Vanc/cefepime for broad spectrum coverage. CT abd/pelvis. Glucostablizier started, K 6.0, glucose 934. She is in DKA. Anion gap of 38. Case discussed with attending Dr. Elesa Massed who also evaluated patient and agrees with plan of care. 3:03 PM Glucose down to 454. CT showing minimal prominence of periappendiceal fat without definite inflammatory changes the appendix. I spoke with Dr. Lovell Sheehan with surgery who states he will consult patient once she is admitted to the floor, cannot operate until DKA resolved and sugar controlled. 3:53 PM I spoke with Dr. Sudie Bailey, patient's PCP who will admit her (Hospitalist overflow).   Trevor Mace, PA-C 05/19/13 1554

## 2013-05-19 NOTE — ED Notes (Signed)
Pt moaning out at times, family at bedside, pt c/o right side flank/back pain. Family stated that she has been out of her test strips x2 days. Due to lack of money. This pain started last night.

## 2013-05-20 LAB — GLUCOSE, CAPILLARY
Glucose-Capillary: 283 mg/dL — ABNORMAL HIGH (ref 70–99)
Glucose-Capillary: 449 mg/dL — ABNORMAL HIGH (ref 70–99)
Glucose-Capillary: 144 mg/dL — ABNORMAL HIGH (ref 70–99)
Glucose-Capillary: 219 mg/dL — ABNORMAL HIGH (ref 70–99)

## 2013-05-20 LAB — BASIC METABOLIC PANEL
BUN: 20 mg/dL (ref 6–23)
CO2: 19 mEq/L (ref 19–32)
Calcium: 8.5 mg/dL (ref 8.4–10.5)
Chloride: 98 mEq/L (ref 96–112)
Creatinine, Ser: 0.85 mg/dL (ref 0.50–1.10)
GFR calc Af Amer: >90 mL/min (ref >90)
GFR calc non Af Amer: 86 mL/min — ABNORMAL LOW (ref >90)
Sodium: 134 mEq/L — ABNORMAL LOW (ref 135–145)

## 2013-05-20 LAB — CBC
MCH: 33.1 pg (ref 26.0–34.0)
MCHC: 34.1 g/dL (ref 30.0–36.0)
MCV: 96.9 fL (ref 78.0–100.0)
Platelets: 284 10*3/uL (ref 150–400)
RBC: 3.57 MIL/uL — ABNORMAL LOW (ref 3.87–5.11)
RDW: 12.5 % (ref 11.5–15.5)

## 2013-05-20 LAB — URINE CULTURE
Colony Count: NO GROWTH
Culture: NO GROWTH

## 2013-05-20 MED ORDER — GABAPENTIN 300 MG PO CAPS
300.0000 mg | ORAL_CAPSULE | Freq: Three times a day (TID) | ORAL | Status: DC
  Administered 2013-05-20 – 2013-05-22 (×7): 300 mg via ORAL
  Filled 2013-05-20 (×8): qty 1

## 2013-05-20 MED ORDER — OXYCODONE-ACETAMINOPHEN 5-325 MG PO TABS
1.0000 | ORAL_TABLET | ORAL | Status: DC | PRN
  Administered 2013-05-20 – 2013-05-22 (×6): 1 via ORAL
  Filled 2013-05-20 (×7): qty 1

## 2013-05-20 MED ORDER — INSULIN ASPART 100 UNIT/ML ~~LOC~~ SOLN
0.0000 [IU] | Freq: Three times a day (TID) | SUBCUTANEOUS | Status: DC
  Administered 2013-05-20: 8 [IU] via SUBCUTANEOUS
  Administered 2013-05-20: 2 [IU] via SUBCUTANEOUS
  Administered 2013-05-21: 11 [IU] via SUBCUTANEOUS
  Administered 2013-05-21: 8 [IU] via SUBCUTANEOUS
  Administered 2013-05-21: 2 [IU] via SUBCUTANEOUS
  Administered 2013-05-22: 3 [IU] via SUBCUTANEOUS
  Administered 2013-05-22: 15 [IU] via SUBCUTANEOUS

## 2013-05-20 MED ORDER — ALPRAZOLAM 1 MG PO TABS
1.0000 mg | ORAL_TABLET | Freq: Four times a day (QID) | ORAL | Status: DC
  Administered 2013-05-20 – 2013-05-22 (×9): 1 mg via ORAL
  Filled 2013-05-20 (×5): qty 1
  Filled 2013-05-20: qty 2
  Filled 2013-05-20 (×3): qty 1

## 2013-05-20 MED ORDER — OXYCODONE HCL 5 MG PO TABS
5.0000 mg | ORAL_TABLET | ORAL | Status: DC | PRN
  Administered 2013-05-20 – 2013-05-22 (×6): 5 mg via ORAL
  Filled 2013-05-20 (×7): qty 1

## 2013-05-20 MED ORDER — MIRTAZAPINE 30 MG PO TABS
30.0000 mg | ORAL_TABLET | Freq: Every day | ORAL | Status: DC
  Administered 2013-05-20 – 2013-05-21 (×2): 30 mg via ORAL
  Filled 2013-05-20 (×2): qty 1

## 2013-05-20 MED ORDER — INSULIN ASPART 100 UNIT/ML ~~LOC~~ SOLN
4.0000 [IU] | Freq: Three times a day (TID) | SUBCUTANEOUS | Status: DC
  Administered 2013-05-20 – 2013-05-22 (×7): 4 [IU] via SUBCUTANEOUS

## 2013-05-20 MED ORDER — INSULIN GLARGINE 100 UNIT/ML ~~LOC~~ SOLN
25.0000 [IU] | Freq: Every day | SUBCUTANEOUS | Status: DC
  Administered 2013-05-20 – 2013-05-22 (×3): 25 [IU] via SUBCUTANEOUS
  Filled 2013-05-20 (×7): qty 0.25

## 2013-05-20 MED ORDER — TRAZODONE HCL 50 MG PO TABS
150.0000 mg | ORAL_TABLET | Freq: Every day | ORAL | Status: DC
  Administered 2013-05-20 – 2013-05-21 (×2): 150 mg via ORAL
  Filled 2013-05-20 (×2): qty 3

## 2013-05-20 MED ORDER — OXYCODONE-ACETAMINOPHEN 10-325 MG PO TABS: 1.0000 | ORAL_TABLET | ORAL | Status: DC | PRN

## 2013-05-20 MED ORDER — AMPHETAMINE-DEXTROAMPHET ER 30 MG PO CP24
30.0000 mg | ORAL_CAPSULE | Freq: Every day | ORAL | Status: DC
  Administered 2013-05-22: 30 mg via ORAL
  Filled 2013-05-20 (×2): qty 1

## 2013-05-20 NOTE — Care Management Note (Addendum)
    Page 1 of 1   05/22/2013     1:43:52 PM   CARE MANAGEMENT NOTE 05/22/2013  Patient:  [name], [name]   Account Number:  [id number]  Date Initiated:  05/20/2013  Documentation initiated by:  Sharrie Rothman  Subjective/Objective Assessment:   Pt admitted from home with DKA. Pt lives with her father but will be moving in with her mother after discharge. Pt is fairly independent with ADL's.     Action/Plan:   Pt stated that her Medicaid had lasped but had recently received a letter that is has been reinstated but her cards have not come in the mail yet. Financial counselor will check to see if Medicaid active. Pt verbalized understanding that if   Anticipated DC Date:  05/22/2013   Anticipated DC Plan:  HOME/SELF CARE      DC Planning Services  CM consult      Choice offered to / List presented to:             Status of service:  Completed, signed off Medicare Important Message given?   (If response is "NO", the following Medicare IM given date fields will be blank) Date Medicare IM given:   Date Additional Medicare IM given:    Discharge Disposition:  HOME/SELF CARE  Per UR Regulation:    If discussed at Long Length of Stay Meetings, dates discussed:    Comments:  05/22/13 Rosemary Holms RN BSN CM Pt given Lantus voucher and reminded to contact her SW at [employer], Andi Hence regarding Medicaid coverage  05/21/13 Rosemary Holms RN BNS CM Spoke to  Lubertha Basque regarding Medicaid status. Per Kathie Rhodes, pt left her application with DSS on 05/12/13. pt is not however active in the Medicaid system. Kathie Rhodes has left a message with the DSS representive. If not yet on Medicaid, Hilda Lias, [place] Diabetic Coordinator left a voucher for Lantus with CM  05/20/13 1515 Arlyss Queen, RN BSN CM Medicaid is active then voucher could not be given. Will continue to follow.

## 2013-05-20 NOTE — Progress Notes (Signed)
Spoke with patient about diabetes and home regimen for diabetes control.  Patient has Type 1 diabetes and was diagnosed at the age of 37 years old.  Patient sees Dr. Evlyn Kanner (Endocrinolgist) for diabetes management.  Patient reports that she was put on an insulin pump (AccuChek Combo) about a year ago; prior to that she was taking Lantus 24 units daily and Novolog ( per faxed information from Dr. Rinaldo Cloud office - carb coverage 1 unit for 15 grams and correction scale 1 unit for every 50 mg/dl above target of 540 mg/dl) for diabetes control.  Asked patient about total daily dose of basal, carb coverage ratio, and insulin sensitivity but patient states her insulin pump is at home and she does not know the information requested.  Called Dr. Rinaldo Cloud office to get information and was faxed most current (05/23/2012) information for insulin pump settings.   Basal setting: 0.8 units/hour Carb ratio: 1 unit for every 15 grams of carbohydrates Sensitivity factor: 1 unit drops blood glucose 50 mg/dl Target glucose: 981 mg/dl  Patient reports that she had Medicaid but it stopped and she just recently got a letter stating that her Medicaid would be reinstated.  As of right now, she has the letter from [facility] but she does not have current Medicaid cards.  She states that she feels her insulin pump is not working correctly and she doesn't trust it.  Therefore, she prefers to go back on Lantus and Novolog for diabetes control until she can follow up with Dr. Evlyn Kanner and have the pump sent off to be checked out.  She states that the only insulin she has at home is her Novolog.  She does not have any Lantus or any strips for her glucometer to test her blood sugar.  Since she does not have her Medicaid card yet she is unsure if she will be able to get her insulin and testing supplies at time of discharge.  She reports that she still has refills for both the Lantus and Novolog at her pharmacy.  Asked that she call them and  see if they can run the prescriptions and see if they are now covered under her reinstated Medicaid.  She states that she will call them after our discussion.  Talked with Arlyss Queen, RN, Case Manager and requested she follow up with the patient to ensure that her insulins will be covered under the reinstated Medicaid and if not Tammy, RN will check to see what can be done for medication assistance.  Provided patient with handout information on the ReliOn glucometer at [facility] for $15 and a box of 50 ReliOn testing strips for $9.  Patient reports that if her Medicaid is not active yet she should be able to pay $20 for a new meter and testing strips from [employer].  Patient was appreciate of information discussed and verbalized understanding of information discussed.  Will continue to follow.  Thanks, Orlando Penner, RN, MSN, CCRN Diabetes Coordinator [facility] [phone] (Team Pager) [phone] ([facility] office) [phone] [facility] office)

## 2013-05-20 NOTE — H&P (Signed)
NAMERONNETT, PULLIN                 ACCOUNT NO.:  [id number]  MEDICAL RECORD NO.:  [id number]  LOCATION:  IC03                          FACILITY:  [facility]  PHYSICIAN:  Mila Homer. Sudie Bailey, M.D.DATE OF BIRTH:  [birth year]  DATE OF ADMISSION:  05/19/2013 DATE OF DISCHARGE:  LH                             HISTORY & PHYSICAL   ADMISSION HISTORY AND PHYSICAL:  This 37 year old developed fairly severe abdominal pain starting within a day or two.  She presented to the Emergency Room at [facility].  She has been having dull sort of discomfort in the lower abdomen off and on for some months and also noted pain in her back and buttock region on the right.  She does have insulin dependent diabetes, diagnosed at age 50.  She has been on the pump recently and sugars have been up and down.  She is usually very meticulous about treating her diabetes, sometimes checking her sugars as many as 6-8 times a day, to get good control.  She has been frustrated by the pump not working recently.  Medications used at home include alprazolam 1 mg every other day for anxiety, gabapentin 300 mg t.i.d., ibuprofen 80 mg t.i.d., mirtazapine 30 mg at bedtime, oxycodone/APAP 10/325 up to q.4 h. for pain, trazodone 50 mg, 3 at bedtime for sleep, and amphetamine/dextroamphetamine 30 mg daily.  Medical problems in addition to the diabetes include fibromyalgia, degenerative disk disease with chronic back pain, chronic shoulder pain, panic attacks.  PHYSICAL EXAMINATION:  GENERAL:  At the time of my exam, she felt much better.  She had been in the emergency room from 9 o'clock in the morning and I saw her at about 6 o'clock at night. VITAL SIGNS:  Her temperature is 97.8, pulse 111, respiratory rate 13, blood pressure 113/56. HEENT:  Her skin turgor is normal and mucous membranes moist. PSYCHIATRIC:  Her speech is normal.  Her sensorium was intact.  Her affect was good. HEART:  Her heart has a  regular rhythm with a rate of about 80. LUNGS:  Clear throughout.  She is moving air well. ABDOMEN:  Still tender throughout and there was some mild guarding.  I could not really identify a point of maximum pain. EXTREMITIES:  She had no edema of the ankles.  Her white blood cell count was 29,300, of which 87% were neutrophils. There were increased bands noted.  Her sodium was 128 initially, now 131, and a potassium initially 6.0, now 3.9, the chloride has gone from 81-96 and a BUN has gone from 36-31 with a creatinine which has gone from 1.37 to 1.5.  Her glucose initially was 934 but now down to 344.  Her lactic acid was 8.9 on a venous sample and troponin less than 0.30. Her D-dimer was less than 0.27 and fibrinogen 463 with an INR of 1.20.  UA was negative and pregnancy test negative.  The urine and blood cultures are pending.  Her admission chest x-ray was negative.  The admission CT scan of the abdomen showed some minimal prominence of the periappendiceal fat in the appendix without inflammation.  Her admission EKG showed a sinus tachycardia with a rate  of 119.  There was an incomplete right bundle-branch block.  ADMISSION DIAGNOSES: 1. Presumptive acute appendicitis. 2. Diabetic ketoacidosis, much improved during the course of day. 3. Chronic type 1 diabetes, now for 25 years. 4. Panic disorder. 5. Degenerative disk disease in the lumbar regions with chronic back     pain. 6. Fibromyalgia.  She has done well on IV fluids and Glucommander treatment of the hyperglycemia.  I think she is stable at this point.  Her potassium has come down nicely and her electrolytes and renal Function have improved.  Her blood pressure is stable, and heart rate down consistent with treatment of her dehydration.  I think she is stable at this point for surgery, either now or in the morning.     Mila Homer. Sudie Bailey, M.D.     SDK/MEDQ  D:  05/19/2013  T:  05/20/2013  Job:   [id number]

## 2013-05-20 NOTE — Progress Notes (Signed)
Subjective: Patient feeling much better. Her abdominal pain is migratory in nature, though she points to left lower quadrant as hurting the most. She states the pain is much better than it has been.  Objective: Vital signs in last 24 hours: Temp:  [97.8 F (36.6 C)-98.6 F (37 C)] 98.4 F (36.9 C) (12/03 0800) Pulse Rate:  [101-123] 112 (12/03 0800) Resp:  [13-24] 15 (12/03 0800) BP: (106-124)/(52-66) 116/66 mmHg (12/03 0800) SpO2:  [93 %-99 %] 98 % (12/03 0800) Weight:  [68.2 kg (150 lb 5.7 oz)-69.3 kg (152 lb 12.5 oz)] 69.3 kg (152 lb 12.5 oz) (12/03 0551) Last BM Date: 05/19/13  Intake/Output from previous day: 12/02 0701 - 12/03 0700 In: 548.4 [I.V.:448.4; IV Piggyback:100] Out: 1 [Urine:1] Intake/Output this shift: Total I/O In: -  Out: 1 [Urine:1]  General appearance: alert, cooperative and no distress GI: Soft. Seems to have tenderness more in the left lower quadrant and left groin regions. No rigidity is noted. I could not elicit any significant right lower quadrant abdominal pain.  Lab Results:   Recent Labs  05/19/13 1028 05/20/13 0435  WBC 29.3* 22.0*  HGB 14.4 DELTA CHECK NOTED  HCT 45.4 34.6*  PLT 362 284   BMET  Recent Labs  05/19/13 1604 05/20/13 0435  NA 131* 134*  K 3.9 3.5  CL 96 98  CO2 19 19  GLUCOSE 344* 302*  BUN 31* 20  CREATININE 1.15* 0.85  CALCIUM 8.5 8.5   PT/INR  Recent Labs  05/19/13 1028  LABPROT 14.9  INR 1.20    Studies/Results: Dg Chest 2 View  05/19/2013   CLINICAL DATA:  Right-sided back pain, kidney pain.  EXAM: CHEST  2 VIEW  COMPARISON:  None.  FINDINGS: The heart size and mediastinal contours are within normal limits. Both lungs are clear. The visualized skeletal structures are unremarkable.  IMPRESSION: No active cardiopulmonary disease.   Electronically Signed   By: Elige Ko   On: 05/19/2013 11:17   Ct Abdomen Pelvis W Contrast  05/19/2013   CLINICAL DATA:  Elevated white blood cell count, right flank  pain and nausea and vomiting  EXAM: CT ABDOMEN AND PELVIS WITH CONTRAST  TECHNIQUE: Multidetector CT imaging of the abdomen and pelvis was performed using the standard protocol following bolus administration of intravenous contrast.  CONTRAST:  43 mL OMNIPAQUE IOHEXOL 300 MG/ML SOLN, the patient also received oral contrast.  COMPARISON:  CT scan of the abdomen and pelvis dated February 29, 2012.  FINDINGS: The liver exhibits no focal mass nor ductal dilation. Mildly decreased density within the liver may reflect fatty infiltration. The gallbladder is adequately distended with no evidence of stones or acute inflammation. The spleen, moderately distended stomach, duodenum, adrenal glands, and pancreas exhibit no acute abnormalities. The kidneys are normal in contour. There is no hydronephrosis. The appearance of the renal parenchyma does not suggest pyelonephritis. No calcified stones are evident. The psoas musculature appears normal in density and contour. The caliber of the abdominal aorta is normal.  The partially contrast filled loops of small and large bowel exhibit no evidence of ileus nor obstruction. The appendix is demonstrated on images 44 through 56. There is a small amount of contrast within the appendix. There is no discrete appendicolith. The maximal measured diameter of the appendix is 10 mm. There is minimal prominence of the periappendiceal fat near the base of the appendix adjacent to the tip of the cecum on images 44-49. There is no discrete abscess. There is no  evidence of a small or large bowel obstruction. There is no evidence of enteritis or colitis or diverticulitis. There is no significant diverticulosis.  The urinary bladder is moderately distended. There are phleboliths within the pelvis. The uterus is retroverted. There is fluid in the endometrial cavity. There is no suspicious adnexal mass. There may be small cysts associated with the left ovary. There is no inguinal nor significant  umbilical hernia.  The lung bases exhibit no infiltrates. There is minimal pleural thickening posteriorly, bilaterally. The lumbar vertebral bodies are preserved in height. The bony pelvis appears normal.  IMPRESSION: 1. There is minimal prominence of the periappendiceal fat without definite inflammatory change of the appendix itself. This is a subtle finding and may not be clinically significant but correlation with patient's clinical exam is needed. There is no evidence of a small or large bowel obstruction or acute inflammation. 2. There is no acute hepatobiliary abnormality. There is no acute urinary tract abnormality. Specifically there are no findings to suggest calcified urinary tract stones nor hydronephrosis. There is no CT evidence of pyelonephritis. 3. Irregular density of the left ovary likely reflects multiple subcentimeter cysts. 4. There is no evidence of an intra-abdominal or pelvic abscess nor lymphadenopathy.   Electronically Signed   By: David  [place]   On: 05/19/2013 14:35    Anti-infectives: Anti-infectives   Start     Dose/Rate Route Frequency Ordered Stop   05/20/13 0600  ceFEPIme (MAXIPIME) 1 g in dextrose 5 % 50 mL IVPB     1 g 100 mL/hr over 30 Minutes Intravenous Every 12 hours 05/19/13 1616     05/19/13 2300  vancomycin (VANCOCIN) 500 mg in sodium chloride 0.9 % 100 mL IVPB     500 mg 100 mL/hr over 60 Minutes Intravenous Every 12 hours 05/19/13 1617     05/19/13 1200  ceFEPIme (MAXIPIME) 2 g in dextrose 5 % 50 mL IVPB     2 g 100 mL/hr over 30 Minutes Intravenous  Once 05/19/13 1155 05/19/13 1401   05/19/13 1115  piperacillin-tazobactam (ZOSYN) IVPB 3.375 g  Status:  Discontinued     3.375 g 100 mL/hr over 30 Minutes Intravenous  Once 05/19/13 1112 05/19/13 1122   05/19/13 1115  vancomycin (VANCOCIN) IVPB 1000 mg/200 mL premix     1,000 mg 200 mL/hr over 60 Minutes Intravenous  Once 05/19/13 1112 05/19/13 1250      Assessment/Plan: Impression: Abdominal pain of  unknown etiology, does not clinically have acute appendicitis. The CT findings were soft, and she does not have clinical progression of appendicitis. Given her improvement in physical findings, I would not repeat the CT scan the abdomen this time. We'll follow peripherally with you.  LOS: 1 day    Santosh Petter A 05/20/2013

## 2013-05-20 NOTE — Progress Notes (Signed)
eLink Physician-Brief Progress Note Patient Name: [name] DOB: August 24, 1975 MRN: [id number]  Date of Service  05/20/2013   HPI/Events of Note  Best Practice   eICU Interventions  SCDs ordered while patient is on bedrest.  May D/C when patient is fully ambulatory.   Intervention Category Intermediate Interventions: Best-practice therapies (e.g. DVT, beta blocker, etc.)  Nanetta Wiegman 05/20/2013, 12:43 AM

## 2013-05-20 NOTE — Progress Notes (Signed)
[name]:[id number] DOB: July 21, 1975 DOA: 05/19/2013 PCP: Milana Obey, MD   Subjective: This lady clearly feels better overall. She does have still abdominal pain but this is improved. There is no nausea or vomiting. She has been n.p.o. Her diabetic ketoacidosis has now resolved clinically and biochemically. She does not have a fever.           Physical Exam: Blood pressure 116/66, pulse 112, temperature 98.4 F (36.9 C), temperature source Oral, resp. rate 15, height 5\' 3"  (1.6 m), weight 69.3 kg (152 lb 12.5 oz), last menstrual period 05/05/2013, SpO2 98.00%. She looks systemically well. She does not look clinically dehydrated. She is not septic or toxic clinically either. Lung fields are clear. Heart sounds are present without murmurs. Her abdomen is soft and mildly tender in a generalized fashion and especially on the right lower quadrant but does not have any real rebound tenderness or guarding in this area. She is alert and oriented without any focal neurological signs.   Investigations:  Recent Results (from the past 240 hour(s))  MRSA PCR SCREENING     Status: None   Collection Time    05/19/13  4:45 PM      Result Value Range Status   MRSA by PCR NEGATIVE  NEGATIVE Final   Comment:            The GeneXpert MRSA Assay (FDA     approved for NASAL specimens     only), is one component of a     comprehensive MRSA colonization     surveillance program. It is not     intended to diagnose MRSA     infection nor to guide or     monitor treatment for     MRSA infections.     Basic Metabolic Panel:  Recent Labs  86/57/84 1604 05/20/13 0435  NA 131* 134*  K 3.9 3.5  CL 96 98  CO2 19 19  GLUCOSE 344* 302*  BUN 31* 20  CREATININE 1.15* 0.85  CALCIUM 8.5 8.5   Liver Function Tests:  Recent Labs  05/19/13 1028  AST 16  ALT 15  ALKPHOS 186*  BILITOT 0.4  PROT 8.3  ALBUMIN 4.3     CBC:  Recent Labs  05/19/13 1028 05/19/13 1245  05/20/13 0435  WBC 29.3*  --  22.0*  NEUTROABS  --  24.3*  --   HGB 14.4  --  DELTA CHECK NOTED  HCT 45.4  --  34.6*  MCV 106.8*  --  96.9  PLT 362  --  284    Dg Chest 2 View  05/19/2013   CLINICAL DATA:  Right-sided back pain, kidney pain.  EXAM: CHEST  2 VIEW  COMPARISON:  None.  FINDINGS: The heart size and mediastinal contours are within normal limits. Both lungs are clear. The visualized skeletal structures are unremarkable.  IMPRESSION: No active cardiopulmonary disease.   Electronically Signed   By: Elige Ko   On: 05/19/2013 11:17   Ct Abdomen Pelvis W Contrast  05/19/2013   CLINICAL DATA:  Elevated white blood cell count, right flank pain and nausea and vomiting  EXAM: CT ABDOMEN AND PELVIS WITH CONTRAST  TECHNIQUE: Multidetector CT imaging of the abdomen and pelvis was performed using the standard protocol following bolus administration of intravenous contrast.  CONTRAST:  43 mL OMNIPAQUE IOHEXOL 300 MG/ML SOLN, the patient also received oral contrast.  COMPARISON:  CT scan of the abdomen and pelvis dated February 29, 2012.  FINDINGS: The liver exhibits no focal mass nor ductal dilation. Mildly decreased density within the liver may reflect fatty infiltration. The gallbladder is adequately distended with no evidence of stones or acute inflammation. The spleen, moderately distended stomach, duodenum, adrenal glands, and pancreas exhibit no acute abnormalities. The kidneys are normal in contour. There is no hydronephrosis. The appearance of the renal parenchyma does not suggest pyelonephritis. No calcified stones are evident. The psoas musculature appears normal in density and contour. The caliber of the abdominal aorta is normal.  The partially contrast filled loops of small and large bowel exhibit no evidence of ileus nor obstruction. The appendix is demonstrated on images 44 through 56. There is a small amount of contrast within the appendix. There is no discrete appendicolith. The  maximal measured diameter of the appendix is 10 mm. There is minimal prominence of the periappendiceal fat near the base of the appendix adjacent to the tip of the cecum on images 44-49. There is no discrete abscess. There is no evidence of a small or large bowel obstruction. There is no evidence of enteritis or colitis or diverticulitis. There is no significant diverticulosis.  The urinary bladder is moderately distended. There are phleboliths within the pelvis. The uterus is retroverted. There is fluid in the endometrial cavity. There is no suspicious adnexal mass. There may be small cysts associated with the left ovary. There is no inguinal nor significant umbilical hernia.  The lung bases exhibit no infiltrates. There is minimal pleural thickening posteriorly, bilaterally. The lumbar vertebral bodies are preserved in height. The bony pelvis appears normal.  IMPRESSION: 1. There is minimal prominence of the periappendiceal fat without definite inflammatory change of the appendix itself. This is a subtle finding and may not be clinically significant but correlation with patient's clinical exam is needed. There is no evidence of a small or large bowel obstruction or acute inflammation. 2. There is no acute hepatobiliary abnormality. There is no acute urinary tract abnormality. Specifically there are no findings to suggest calcified urinary tract stones nor hydronephrosis. There is no CT evidence of pyelonephritis. 3. Irregular density of the left ovary likely reflects multiple subcentimeter cysts. 4. There is no evidence of an intra-abdominal or pelvic abscess nor lymphadenopathy.   Electronically Signed   By: David  [place]   On: 05/19/2013 14:35      Medications: I have reviewed the patient's current medications.  Impression: 1. Diabetic ketoacidosis, resolved. Type 1 diabetes mellitus. 2. Abdominal pain, unclear etiology. No convincing clinical or radiological evidence for acute appendicitis. 3. Panic  disorder. 4. 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 5. Fibromyalgia. 6. Tobacco abuse, ongoing.     Plan: 1. Discontinue IV insulin. 2. Start subcutaneous insulin with sliding scale of insulin. 3. Start a diet and advance as tolerated. 4. Appreciate surgical followup. She may need repeat CT scan to definitively rule in or rule out appendicitis. 5. I discussed at length regarding her diet and tobacco abuse. 6.The patient can move to the medical floor.  Consultants:  Surgery, Dr. Lovell Sheehan.   Procedures:  None.   Antibiotics:  Intravenous vancomycin and cefepime started yesterday.                   Code Status: Full code.  Family Communication: Discussed plan with patient at the bedside.   Disposition Plan: Home when medically stable.  Time spent: 30 minutes.   LOS: 1 day   Wilson Singer Pager [phone]  05/20/2013, 9:03 AM

## 2013-05-20 NOTE — Progress Notes (Signed)
Report called to RN. Pt to be transferred to med-surg via wheelchair with personal belongings.

## 2013-05-20 NOTE — Progress Notes (Signed)
UR chart review completed.  

## 2013-05-21 LAB — GLUCOSE, CAPILLARY
Glucose-Capillary: 290 mg/dL — ABNORMAL HIGH (ref 70–99)
Glucose-Capillary: 292 mg/dL — ABNORMAL HIGH (ref 70–99)
Glucose-Capillary: 135 mg/dL — ABNORMAL HIGH (ref 70–99)
Glucose-Capillary: 312 mg/dL — ABNORMAL HIGH (ref 70–99)

## 2013-05-21 LAB — CBC
HCT: 32.1 % — ABNORMAL LOW (ref 36.0–46.0)
Hemoglobin: 10.8 g/dL — ABNORMAL LOW (ref 12.0–15.0)
MCH: 32.9 pg (ref 26.0–34.0)
MCHC: 33.6 g/dL (ref 30.0–36.0)
MCV: 97.9 fL (ref 78.0–100.0)
Platelets: 203 10*3/uL (ref 150–400)
RDW: 12.7 % (ref 11.5–15.5)
WBC: 7 10*3/uL (ref 4.0–10.5)

## 2013-05-21 LAB — COMPREHENSIVE METABOLIC PANEL
Albumin: 2.9 g/dL — ABNORMAL LOW (ref 3.5–5.2)
Alkaline Phosphatase: 107 U/L (ref 39–117)
BUN: 8 mg/dL (ref 6–23)
Calcium: 8.4 mg/dL (ref 8.4–10.5)
Chloride: 104 mEq/L (ref 96–112)
GFR calc Af Amer: >90 mL/min (ref >90)
Glucose, Bld: 271 mg/dL — ABNORMAL HIGH (ref 70–99)
Potassium: 3.8 mEq/L (ref 3.5–5.1)
Total Bilirubin: 0.3 mg/dL (ref 0.3–1.2)

## 2013-05-21 NOTE — Progress Notes (Signed)
[name], BEZOLD                 ACCOUNT NO.:  [id number]  MEDICAL RECORD NO.:  [id number]  LOCATION:  A319                          FACILITY:  [facility]  PHYSICIAN:  Tanina Barb G. Renard Matter, MD   DATE OF BIRTH:  December 22, 1975  DATE OF PROCEDURE: DATE OF DISCHARGE:                                PROGRESS NOTE   SUBJECTIVE:  This patient has abdominal pain of unknown etiology.  She was seen and evaluated for possible acute appendicitis, but Dr. Lovell Sheehan did not feel that she had appendicitis.  He felt that she was improving, she has been placed on Zosyn and vancomycin as well as Maxipime.  She is complaining of low back pain and sore throat this a.m.  OBJECTIVE:  VITAL SIGNS:  Blood pressure 141/81, respirations 16, pulse 101, temp 98.1. HEENT:  Negative, slightly inflamed pharynx. HEART:  Regular rhythm.  No murmurs. LUNGS:  Clear to P and A. ABDOMEN:  No localized tenderness.  ASSESSMENT:  The patient was admitted with abdominal pain of an undetermined etiology.  She does have history of diabetes and diabetes ketoacidosis.  PLAN:  To continue IV antibiotics and IV fluids.  Continue to monitor her blood sugars.  Continue same insulin dosage, Lantus 25 units daily and NovoLog by sliding scale.     [name] G. Renard Matter, MD     AGM/MEDQ  D:  05/21/2013  T:  05/21/2013  Job:  [id number]

## 2013-05-21 NOTE — Progress Notes (Signed)
Inpatient Diabetes Program Recommendations  AACE/ADA: New Consensus Statement on Inpatient Glycemic Control (2013)  Target Ranges:  Prepandial:   less than 140 mg/dL      Peak postprandial:   less than 180 mg/dL (1-2 hours)      Critically ill patients:  140 - 180 mg/dL   Results for [name], [name] (MRN [id number]) as of 05/21/2013 08:40  Ref. Range 05/20/2013 07:52 05/20/2013 11:17 05/20/2013 12:09 05/20/2013 15:47 05/20/2013 16:30 05/20/2013 19:42 05/20/2013 21:57 05/21/2013 00:25 05/21/2013 06:23 05/21/2013 08:10  Glucose-Capillary Latest Range: 70-99 mg/dL 130 (H) 865 (H) 784 (H) 163 (H) 144 (H) 219 (H) 190 (H) 159 (H) 290 (H) 292 (H)    Inpatient Diabetes Program Recommendations Insulin - Basal: Please consider increasing Lantus to 27 units daily.  Thanks, Orlando Penner, RN, MSN, CCRN Diabetes Coordinator Inpatient Diabetes Program [phone] (Team Pager) [phone] ([facility] office) [phone] [facility] office)

## 2013-05-22 LAB — GLUCOSE, CAPILLARY: Glucose-Capillary: 200 mg/dL — ABNORMAL HIGH (ref 70–99)

## 2013-05-22 MED ORDER — INSULIN GLARGINE 100 UNIT/ML ~~LOC~~ SOLN
5.0000 [IU] | SUBCUTANEOUS | Status: AC
  Administered 2013-05-22: 5 [IU] via SUBCUTANEOUS
  Filled 2013-05-22: qty 0.05

## 2013-05-22 NOTE — Progress Notes (Signed)
Patient discharged home.  IV removed - WNL.  Instructed by Dr. Renard Matter to come by his office when leaving hospital for insulin prescription.  Patient given address and phone number to office.  Patient verbalizes understanding of diabetic diet and how to check blood sugar, and given insulin.  Instructed to take insulin as prescribed to prevent any other future DKA episodes.  Given diet carentoes for reference.  No questions at this time.  Patient awaiting arrival of ride. Stable to DC home

## 2013-05-22 NOTE — Discharge Summary (Signed)
[name], [name]                 ACCOUNT NO.:  [id number]  MEDICAL RECORD NO.:  [id number]  LOCATION:  A319                          FACILITY:  [facility]  PHYSICIAN:  Lysbeth Dicola G. Renard Matter, MD   DATE OF BIRTH:  [birth year]  DATE OF ADMISSION:  05/19/2013 DATE OF DISCHARGE:  LH                              DISCHARGE SUMMARY   ADDENDUM:  The patient will be discharged on following medications: 1. Neurontin 300 mg daily. 2. Desyrel 150 mg at bedtime. 3. Xanax 1 mg 4 times a day. 4. Ibuprofen 800 mg t.i.d. 5. Remeron 30 mg at bedtime. 6. Percocet 10/325 every 4 hours as needed for pain. 7. Aspirin/acetaminophen 500/325 mg every 4 hours as needed for     moderate pain. 8. Adderall XR 30 mg daily. 9. She will also need a Lantus insulin 25 units daily and NovoLog by     sliding scale.  These will be provided for her through my office.     The patient was asked to come by and pick these up and continue     these insulin dosages. 10.To continue Remeron 30 mg daily at bedtime.     Wren Pryce G. Renard Matter, MD     AGM/MEDQ  D:  05/22/2013  T:  05/22/2013  Job:  [id number]

## 2013-05-22 NOTE — Progress Notes (Signed)
Patients sugar 467 this AM - MD notified.  Orders for an extra 5 units of Lantus to be given.  No changes to SSI or meal coverage made.  MD states to keep patient until lunchtime to check sugar again before discharging

## 2013-05-24 LAB — CULTURE, BLOOD (ROUTINE X 2): Culture: NO GROWTH

## 2013-05-25 NOTE — Discharge Summary (Signed)
[name], [name]                 ACCOUNT NO.:  [id number]  MEDICAL RECORD NO.:  [id number]  LOCATION:                                 FACILITY:  PHYSICIAN:  Annasophia Crocker G. Renard Matter, MD   DATE OF BIRTH:  Sep 15, 1975  DATE OF ADMISSION:  05/19/2013 DATE OF DISCHARGE:  12/05/2014LH                              DISCHARGE SUMMARY   DIAGNOSES: 1. Type 1 diabetes with ketoacidosis. 2. Hyponatremia. 3. Lower abdominal pain, left lower quadrant and groin of undetermined     etiology.  No evidence of appendicitis or acute surgical abdomen,     panic disorder, fibromyalgia, degenerative disk disease of the L-     spine with chronic pain.  CONDITION:  Stable at the time of her discharge.  This 37 year old, patient had severe abdominal pain, presented to the emergency room at [facility].  Apparently, she had been having some discomfort in the lower abdomen over a period of several months and noted pain on her back and buttock on right.  She does have insulin-dependent diabetes since age 68, had been on insulin pump, but had been having some problem with this and desired to go on Lantus or Levemir insulin in hospital setting and along with a sliding scale.  She also has fibromyalgia and degenerative disk disease, chronic back and shoulder pain and panic attacks.  Apparently, she had ketoacidosis at the time of admission, but this been improving some and did not require aggressive treatment.  PHYSICAL EXAMINATION:  VITAL SIGNS:  Blood pressure 113/56, respirations 13, pulse 111, temp 97.8. HEENT:  Eyes PERRLA.  TM negative.  Oropharynx benign. LUNGS:  Clear to P and A. HEART:  Regular rhythm, rate of 80. ABDOMEN:  Tender throughout with mild guarding. EXTREMITIES:  Free of edema.  LABORATORY DATA:  On admission, CBC, WBC 29,300, 87% neutrophils. Chemistries, sodium 131, potassium 6.0 down to 3.9, chloride 81 to 96, BUN 36 to 31.  Initial glucose was 934 but down to 344  shortly after admission, lactic acid 8.9, and D-dimer less than 0.27.  Fibrinogen is 463.  INR 1.20.  UA negative.  Pregnancy test negative.  CBC on May 21, 2013, WBC 7.0, hemoglobin 10.8, hematocrit 32.1.  Chemistries on same date, sodium 139, potassium 3.8, chloride 104, CO2 is 27, BUN 8, creatinine 0.74.  Troponin 8.4.  Sugars varied from a high down to range of 271.  Sugars prior to the day of discharge ranged from 166-312. Radiology CT of the abdomen, no acute hepatobiliary abnormality, irregular density on left ovary reflects cyst, no evidence of intraabdominal pelvic abscess or lymphadenopathy.  No evidence of acute appendicitis.  Chest x-ray, heart size and mediastinal borders within normal limits.  No active cardiopulmonary disease.  HOSPITAL COURSE:  The patient was thought possibly initially to have an acute abdomen, was seen in consult consultation by Dr. Lovell Sheehan who did not feel that she had acute appendicitis.  The patient was continued on the following medications, vancomycin 500 mg q.12 h., Maxipime 1 g every 12 h., Neurontin 300 mg t.i.d., Lantus insulin 25 units daily, NovoLog insulin 4 units t.i.d., gabapentin 300 mg t.i.d., and Xanax 1  mg q.i.d., Adderall 24 hour capsule 30 mg daily.  She was given fluids and occasionally had to have Dilaudid for pain, but this gradually improved. It was felt by the third hospital day, she could be discharged home. Arrangement will be made for her to get continued bolus insulin as well as short-acting insulin.  There was some concern that the patient was not able to get this as an outpatient because of cost and lack of funds. Arrangements are being made for her to get her long-acting insulin and short-acting insulin as well and the patient will be followed up closely by Dr. Sudie Bailey as an outpatient.     [name] G. Renard Matter, MD     AGM/MEDQ  D:  05/22/2013  T:  05/22/2013  Job:  [id number]

## 2013-06-08 ENCOUNTER — Ambulatory Visit: Payer: Self-pay | Admitting: Obstetrics and Gynecology

## 2013-07-06 LAB — BLOOD GAS, VENOUS
Acid-base deficit: 16.4 mmol/L — ABNORMAL HIGH (ref 0.0–2.0)
Bicarbonate: 9.9 mEq/L — ABNORMAL LOW (ref 20.0–24.0)
O2 Saturation: 95.3 %
Patient temperature: 37
TCO2: 9.2 mmol/L (ref 0–100)
pCO2, Ven: 25.2 mmHg — ABNORMAL LOW (ref 45.0–50.0)
pH, Ven: 7.216 — ABNORMAL LOW (ref 7.250–7.300)
pO2, Ven: 89.6 mmHg — ABNORMAL HIGH (ref 30.0–45.0)

## 2013-07-24 ENCOUNTER — Other Ambulatory Visit (HOSPITAL_COMMUNITY): Payer: Self-pay

## 2013-09-01 ENCOUNTER — Encounter (HOSPITAL_COMMUNITY): Payer: Self-pay | Admitting: Emergency Medicine

## 2013-09-01 ENCOUNTER — Emergency Department (HOSPITAL_COMMUNITY)
Admission: EM | Admit: 2013-09-01 | Discharge: 2013-09-01 | Disposition: A | Discharge status: Home / Self Care | Payer: Medicaid Other | Attending: Emergency Medicine | Admitting: Emergency Medicine

## 2013-09-01 DIAGNOSIS — E119 Type 2 diabetes mellitus without complications: Secondary | ICD-10-CM | POA: Insufficient documentation

## 2013-09-01 DIAGNOSIS — Z794 Long term (current) use of insulin: Secondary | ICD-10-CM | POA: Insufficient documentation

## 2013-09-01 DIAGNOSIS — Z79899 Other long term (current) drug therapy: Secondary | ICD-10-CM | POA: Insufficient documentation

## 2013-09-01 DIAGNOSIS — G8929 Other chronic pain: Secondary | ICD-10-CM | POA: Insufficient documentation

## 2013-09-01 DIAGNOSIS — Z791 Long term (current) use of non-steroidal anti-inflammatories (NSAID): Secondary | ICD-10-CM | POA: Insufficient documentation

## 2013-09-01 DIAGNOSIS — F430 Acute stress reaction: Secondary | ICD-10-CM | POA: Insufficient documentation

## 2013-09-01 DIAGNOSIS — R110 Nausea: Secondary | ICD-10-CM | POA: Insufficient documentation

## 2013-09-01 DIAGNOSIS — G40909 Epilepsy, unspecified, not intractable, without status epilepticus: Secondary | ICD-10-CM | POA: Insufficient documentation

## 2013-09-01 DIAGNOSIS — F410 Panic disorder [episodic paroxysmal anxiety] without agoraphobia: Secondary | ICD-10-CM | POA: Insufficient documentation

## 2013-09-01 DIAGNOSIS — E162 Hypoglycemia, unspecified: Secondary | ICD-10-CM

## 2013-09-01 DIAGNOSIS — F17200 Nicotine dependence, unspecified, uncomplicated: Secondary | ICD-10-CM | POA: Insufficient documentation

## 2013-09-01 DIAGNOSIS — R638 Other symptoms and signs concerning food and fluid intake: Secondary | ICD-10-CM | POA: Insufficient documentation

## 2013-09-01 DIAGNOSIS — Z8739 Personal history of other diseases of the musculoskeletal system and connective tissue: Secondary | ICD-10-CM | POA: Insufficient documentation

## 2013-09-01 DIAGNOSIS — Z880 Allergy status to penicillin: Secondary | ICD-10-CM | POA: Insufficient documentation

## 2013-09-01 LAB — CBG MONITORING, ED
Glucose-Capillary: 51 mg/dL — ABNORMAL LOW (ref 70–99)
Glucose-Capillary: 155 mg/dL — ABNORMAL HIGH (ref 70–99)

## 2013-09-01 MED ORDER — PROMETHAZINE HCL 25 MG PO TABS: 25.0000 mg | ORAL_TABLET | Freq: Four times a day (QID) | ORAL | Status: DC | PRN

## 2013-09-01 NOTE — ED Notes (Signed)
Per family patient had change in mental status 1 hour ago. Per family blood sugar checked 30 minutes ago and blood sugar was 27. Per family patient only took a few sips of apple juice with sugar. Patient mental status better per family.

## 2013-09-01 NOTE — ED Notes (Signed)
Feeding pt, dinner tray, peanut butter crackers, orange juice, diet coke. MD at the bedside. Pt states she has frequent nausea, not prescribed, pt took new dose of of increased insulin today and did not eat.

## 2013-09-01 NOTE — Discharge Instructions (Signed)
Low Blood Sugar Low blood sugar (hypoglycemia) means that the level of sugar in your blood is lower than it should be. Signs of low blood sugar include:  Getting sweaty.  Feeling hungry.  Feeling dizzy or weak.  Feeling sleepier than normal.  Feeling nervous.  Headaches.  Having a fast heartbeat. Low blood sugar can happen fast and can be an emergency. Your doctor can do tests to check your blood sugar level. You can have low blood sugar and not have diabetes. HOME CARE  Check your blood sugar as told by your doctor. If it is less than 70 mg/dl or as told by your doctor, take 1 of the following:  3 to 4 glucose tablets.   cup clear juice.   cup soda pop, not diet.  1 cup milk.  5 to 6 hard candies.  Recheck blood sugar after 15 minutes. Repeat until it is at the right level.  Eat a snack if it is more than 1 hour until the next meal.  Only take medicine as told by your doctor.  Do not skip meals. Eat on time.  Do not drink alcohol except with meals.  Check your blood glucose before driving.  Check your blood glucose before and after exercise.  Always carry treatment with you, such as glucose pills.  Always wear a medical alert bracelet if you have diabetes. GET HELP RIGHT AWAY IF:   Your blood glucose goes below 70 mg/dl or as told by your doctor, and you:  Are confused.  Are not able to swallow.  Pass out (faint).  You cannot treat yourself. You may need someone to help you.  You have low blood sugar problems often.  You have problems from your medicines.  You are not feeling better after 3 to 4 days.  You have vision changes. MAKE SURE YOU:   Understand these instructions.  Will watch this condition.  Will get help right away if you are not doing well or get worse. Document Released: 08/29/2009 Document Revised: 08/27/2011 Document Reviewed: 08/29/2009 [facility] Patient Information 2014 [employer], [place].   Reduce your insulin. Followup  with Dr. Evlyn [place]annerSouth.   Prescription for nausea medication

## 2013-09-01 NOTE — ED Provider Notes (Signed)
CSN: [id number]     Arrival date & time 09/01/13  1810 History  This chart was scribed for Donnetta Hutching, MD by Bennett Scrape, ED Scribe. This patient was seen in room APA08/APA08 and the patient's care was started at 6:32 PM.   Chief Complaint  Patient presents with  . Hypoglycemia     The history is provided by the patient. No language interpreter was used.    HPI Comments: [name] is a 38 y.o. female who presents to the Emergency Department for now resolved AMS that was noted one hour ago. Per family, pt seem "out of it". They checked her blood sugar at 27. Pt is now awake and alert eating and drinking in the ED. Family states that she is back to her baseline. She reports that she is a brittle diabetic with her sugars waxing and waning dramatically since she was in her teens. She states that she had her insulin dose adjusted at her Endocrinologist today to 1 unit for every 5 g of Carbs eaten from 1 unit for every 15 grams of Carbs eaten. Recently her CBGs have been running 335 to 30 every couple of hours. She states that she was on an insulin pump but was taken off. Pt admits that she hasn't been eating due to stress (father is dying of CA and she is a main caregiver). She states that she has only been drinking milk and juice due to feeling nauseated over the past couple of days. She takes pepto bismol at home but reports that she was on phenergan with improvement until she ran out. She denies any other sxs currently.  Dr. Laurene Footman from [facility] is Endocrinologist  PCP is Dr. Sudie Bailey.   Past Medical History  Diagnosis Date  . Panic attacks   . Diabetes mellitus   . Suicidal thoughts   . Seizures     diabetic seizures in past-last was few yrs ago  . Fibromyalgia   . DDD (degenerative disc disease), lumbar   . Chronic back pain   . Chronic shoulder pain    Past Surgical History  Procedure Laterality Date  . Cesarean section  1998    x1  . Hernia repair       bilateral inguinal hernia repairs age 32  . Laparoscopic tubal ligation  07/31/2011    Procedure: LAPAROSCOPIC TUBAL LIGATION;  Surgeon: [name], MD;  Location: [facility];  Service: Gynecology;  Laterality: Bilateral;  laparoscopic bilateral tubal ligation with falope rings   Family History  Problem Relation Age of Onset  . Hypotension Neg Hx   . Anesthesia problems Neg Hx   . Malignant hyperthermia Neg Hx   . Pseudochol deficiency Neg Hx   . Heart disease    . Arthritis    . Cancer    . Diabetes     History  Substance Use Topics  . Smoking status: Current Every Day Smoker -- 0.50 packs/day for 20 years    Types: Cigarettes  . Smokeless tobacco: Never Used  . Alcohol Use: No     Comment: occasional   No OB history provided.  Review of Systems  A complete 10 system review of systems was obtained and all systems are negative except as noted in the HPI and PMH.    Allergies  Penicillins and Levofloxacin  Home Medications   Current Outpatient Rx  Name  Route  Sig  Dispense  Refill  . amphetamine-dextroamphetamine (ADDERALL XR) 30 MG 24 hr  capsule   Oral   Take 30 mg by mouth every morning.          . Aspirin-Acetaminophen (GOODY BODY PAIN) 500-325 MG PACK   Oral   Take 1 Package by mouth daily as needed (Pain).          Marland Kitchen BIOTIN PO   Oral   Take 1 tablet by mouth daily.         . clonazePAM (KLONOPIN) 1 MG tablet   Oral   Take 1 mg by mouth 5 (five) times daily.         Marland Kitchen ibuprofen (ADVIL,MOTRIN) 800 MG tablet   Oral   Take 800 mg by mouth 3 (three) times daily.         . insulin aspart (NOVOLOG FLEXPEN) 100 UNIT/ML FlexPen   Subcutaneous   Inject into the skin 3 (three) times daily with meals. 1 unit per 5g Carbs         . Insulin Glargine (LANTUS SOLOSTAR) 100 UNIT/ML Solostar Pen   Subcutaneous   Inject 15-25 Units into the skin 2 (two) times daily. 15 units in the morning and 25 units at bedtime         . oxyCODONE-acetaminophen  (PERCOCET) 10-325 MG per tablet   Oral   Take 1 tablet by mouth every 4 (four) hours as needed for pain.         . traZODone (DESYREL) 50 MG tablet   Oral   Take 150 mg by mouth at bedtime.          Marland Kitchen zolpidem (AMBIEN) 10 MG tablet   Oral   Take 10 mg by mouth at bedtime.         . promethazine (PHENERGAN) 25 MG tablet   Oral   Take 1 tablet (25 mg total) by mouth every 6 (six) hours as needed for nausea or vomiting.   20 tablet   1    Triage vitals: BP 108/61  Pulse 80  Resp 20  Ht 5\' 3"  (1.6 m)  Wt 152 lb (68.947 kg)  BMI 26.93 kg/m2  SpO2 100%  LMP 08/18/2013  Physical Exam  Nursing note and vitals reviewed. Constitutional: She is oriented to person, place, and time. She appears well-developed and well-nourished.  Pt is eating and drinking   HENT:  Head: Normocephalic and atraumatic.  Eyes: Conjunctivae and EOM are normal. Pupils are equal, round, and reactive to light.  Neck: Normal range of motion. Neck supple.  Cardiovascular: Normal rate, regular rhythm and normal heart sounds.   Pulmonary/Chest: Effort normal and breath sounds normal.  Abdominal: Soft. Bowel sounds are normal.  Musculoskeletal: Normal range of motion.  Neurological: She is alert and oriented to person, place, and time.  Skin: Skin is warm and dry.  Psychiatric: She has a normal mood and affect. Her behavior is normal.    ED Course  Procedures (including critical care time)  DIAGNOSTIC STUDIES: Oxygen Saturation is 100% on RA, normal by my interpretation.    COORDINATION OF CARE: 6:38 PM-Glucose stable and discharge  Labs Review Labs Reviewed  CBG MONITORING, ED - Abnormal; Notable for the following:    Glucose-Capillary 51 (*)    All other components within normal limits  CBG MONITORING, ED - Abnormal; Notable for the following:    Glucose-Capillary 155 (*)    All other components within normal limits  CBG MONITORING, ED   Imaging Review No results found.   EKG  Interpretation None  MDM   Final diagnoses:  Hypoglycemia   patient feeling much better after consuming calories. No neurological deficits. She will reduce her insulin and followup with her endocrinologist   I personally performed the services described in this documentation, which was scribed in my presence. The recorded information has been reviewed and is accurate.    Donnetta HutchingBrian Oluwaferanmi Wain, MD 09/01/13 2023

## 2013-09-01 NOTE — ED Notes (Signed)
Patient given discharge instruction, verbalized understand. Patient ambulatory out of the department.  

## 2013-12-05 ENCOUNTER — Encounter (HOSPITAL_COMMUNITY): Payer: Self-pay | Admitting: Emergency Medicine

## 2013-12-05 ENCOUNTER — Emergency Department (HOSPITAL_COMMUNITY)
Admission: EM | Admit: 2013-12-05 | Discharge: 2013-12-05 | Disposition: A | Discharge status: Home / Self Care | Payer: Medicaid Other | Attending: Emergency Medicine | Admitting: Emergency Medicine

## 2013-12-05 DIAGNOSIS — F17200 Nicotine dependence, unspecified, uncomplicated: Secondary | ICD-10-CM | POA: Insufficient documentation

## 2013-12-05 DIAGNOSIS — Z791 Long term (current) use of non-steroidal anti-inflammatories (NSAID): Secondary | ICD-10-CM | POA: Insufficient documentation

## 2013-12-05 DIAGNOSIS — F410 Panic disorder [episodic paroxysmal anxiety] without agoraphobia: Secondary | ICD-10-CM | POA: Insufficient documentation

## 2013-12-05 DIAGNOSIS — Z8739 Personal history of other diseases of the musculoskeletal system and connective tissue: Secondary | ICD-10-CM | POA: Insufficient documentation

## 2013-12-05 DIAGNOSIS — Z79899 Other long term (current) drug therapy: Secondary | ICD-10-CM | POA: Insufficient documentation

## 2013-12-05 DIAGNOSIS — G40909 Epilepsy, unspecified, not intractable, without status epilepticus: Secondary | ICD-10-CM | POA: Insufficient documentation

## 2013-12-05 DIAGNOSIS — E162 Hypoglycemia, unspecified: Secondary | ICD-10-CM

## 2013-12-05 DIAGNOSIS — E119 Type 2 diabetes mellitus without complications: Secondary | ICD-10-CM | POA: Insufficient documentation

## 2013-12-05 DIAGNOSIS — G8929 Other chronic pain: Secondary | ICD-10-CM | POA: Insufficient documentation

## 2013-12-05 LAB — RAPID URINE DRUG SCREEN, HOSP PERFORMED
Amphetamines: POSITIVE — AB
Barbiturates: NOT DETECTED
Benzodiazepines: POSITIVE — AB
COCAINE: NOT DETECTED
OPIATES: NOT DETECTED
Tetrahydrocannabinol: NOT DETECTED

## 2013-12-05 LAB — COMPREHENSIVE METABOLIC PANEL
ALT: 9 U/L (ref 0–35)
AST: 18 U/L (ref 0–37)
Albumin: 3.5 g/dL (ref 3.5–5.2)
Alkaline Phosphatase: 95 U/L (ref 39–117)
BILIRUBIN TOTAL: 0.2 mg/dL — AB (ref 0.3–1.2)
BUN: 7 mg/dL (ref 6–23)
CO2: 30 meq/L (ref 19–32)
CREATININE: 0.77 mg/dL (ref 0.50–1.10)
Calcium: 8.7 mg/dL (ref 8.4–10.5)
Chloride: 102 mEq/L (ref 96–112)
GFR calc Af Amer: >90 mL/min (ref >90)
GLUCOSE: 32 mg/dL — AB (ref 70–99)
Potassium: 3.7 mEq/L (ref 3.7–5.3)
Sodium: 142 mEq/L (ref 137–147)
Total Protein: 6.7 g/dL (ref 6.0–8.3)

## 2013-12-05 LAB — CBC WITH DIFFERENTIAL/PLATELET
BASOS PCT: 0 % (ref 0–1)
Basophils Absolute: 0 10*3/uL (ref 0.0–0.1)
Eosinophils Absolute: 0.4 10*3/uL (ref 0.0–0.7)
Eosinophils Relative: 7 % — ABNORMAL HIGH (ref 0–5)
HEMATOCRIT: 38.2 % (ref 36.0–46.0)
HEMOGLOBIN: 12.9 g/dL (ref 12.0–15.0)
LYMPHS PCT: 37 % (ref 12–46)
Lymphs Abs: 2.1 10*3/uL (ref 0.7–4.0)
MCH: 31.9 pg (ref 26.0–34.0)
MCHC: 33.8 g/dL (ref 30.0–36.0)
MCV: 94.6 fL (ref 78.0–100.0)
MONO ABS: 0.5 10*3/uL (ref 0.1–1.0)
Monocytes Relative: 10 % (ref 3–12)
Neutro Abs: 2.7 10*3/uL (ref 1.7–7.7)
Neutrophils Relative %: 46 % (ref 43–77)
Platelets: 223 10*3/uL (ref 150–400)
RBC: 4.04 MIL/uL (ref 3.87–5.11)
RDW: 13.1 % (ref 11.5–15.5)
WBC: 5.7 10*3/uL (ref 4.0–10.5)

## 2013-12-05 LAB — CBG MONITORING, ED
GLUCOSE-CAPILLARY: 24 mg/dL — AB (ref 70–99)
GLUCOSE-CAPILLARY: 175 mg/dL — AB (ref 70–99)
GLUCOSE-CAPILLARY: 202 mg/dL — AB (ref 70–99)
Glucose-Capillary: 143 mg/dL — ABNORMAL HIGH (ref 70–99)

## 2013-12-05 LAB — ETHANOL: Alcohol, Ethyl (B): <11 mg/dL (ref 0–11)

## 2013-12-05 MED ORDER — SODIUM CHLORIDE 0.9 % IV SOLN
INTRAVENOUS | Status: DC
  Administered 2013-12-05: 16:00:00 via INTRAVENOUS

## 2013-12-05 MED ORDER — DEXTROSE 50 % IV SOLN
1.0000 | Freq: Once | INTRAVENOUS | Status: AC
  Administered 2013-12-05: 50 mL via INTRAVENOUS

## 2013-12-05 MED ORDER — DEXTROSE 50 % IV SOLN
INTRAVENOUS | Status: AC
  Filled 2013-12-05: qty 50

## 2013-12-05 NOTE — ED Notes (Signed)
CRITICAL VALUE ALERT  Critical value received:  Glucose-32 Date of notification:  12/05/13  Time of notification:  1647  Critical value read back:yes  Nurse who received alert:  T Vogler RN  CBG obtained and D50 given at time of blood draw.

## 2013-12-05 NOTE — ED Notes (Signed)
Per EMS-pt family called ems for ?low blood sugar-pt was yelling. Per EMS-pt reports "drinking tequila all night and passing out this am". Pt found in floor by sister. CBG in route-83.

## 2013-12-05 NOTE — ED Notes (Addendum)
Pt becoming increasingly lethargic and slurred. CBG-24. 1 amp D50 given and edp notified. EDP at bedside. Pt removed insulin pump from her right abdomen and placed at bedside.

## 2013-12-05 NOTE — ED Provider Notes (Signed)
CSN: [id number]     Arrival date & time 12/05/13  1554 History   First MD Initiated Contact with Patient 12/05/13 1555     Chief Complaint  Patient presents with  . Alcohol Intoxication     (Consider location/radiation/quality/duration/timing/severity/associated sxs/prior Treatment) HPI Comments: Patient brought to the emergency department for mental status change. Patient's sister became concerned that she might have been experiencing hypoglycemia because she was confused. EMS was called to the house. EMS reports that the patient had been up all night drinking tequila. She passed out this morning. EMS reported there was also Xanax and other drug paraphernalia in the house. Her sugar was 83. Upon arrival to the ER she is awake and alert. She is slurring her words but answers appropriately. She is without complaints.  Patient is a 38 y.o. female presenting with intoxication.  Alcohol Intoxication Pertinent negatives include no chest pain and no shortness of breath.    Past Medical History  Diagnosis Date  . Panic attacks   . Diabetes mellitus   . Suicidal thoughts   . Seizures     diabetic seizures in past-last was few yrs ago  . Fibromyalgia   . DDD (degenerative disc disease), lumbar   . Chronic back pain   . Chronic shoulder pain    Past Surgical History  Procedure Laterality Date  . Cesarean section  1998    x1  . Hernia repair      bilateral inguinal hernia repairs age 89  . Laparoscopic tubal ligation  07/31/2011    Procedure: LAPAROSCOPIC TUBAL LIGATION;  Surgeon: Tilda BurrowJohn V Ferguson, MD;  Location: [facility];  Service: Gynecology;  Laterality: Bilateral;  laparoscopic bilateral tubal ligation with falope rings   Family History  Problem Relation Age of Onset  . Hypotension Neg Hx   . Anesthesia problems Neg Hx   . Malignant hyperthermia Neg Hx   . Pseudochol deficiency Neg Hx   . Heart disease    . Arthritis    . Cancer    . Diabetes     History  Substance Use Topics   . Smoking status: Current Every Day Smoker -- 0.50 packs/day for 20 years    Types: Cigarettes  . Smokeless tobacco: Never Used  . Alcohol Use: No     Comment: occasional   OB History   Grav Para Term Preterm Abortions TAB SAB Ect Mult Living                 Review of Systems  Respiratory: Negative for shortness of breath.   Cardiovascular: Negative for chest pain.  All other systems reviewed and are negative.     Allergies  Penicillins and Levofloxacin  Home Medications   Prior to Admission medications   Medication Sig Start Date End Date Taking? Authorizing Provider  ALPRAZolam Prudy Feeler(XANAX) 1 MG tablet Take 1 mg by mouth 4 (four) times daily.   Yes Historical Provider, MD  amitriptyline (ELAVIL) 25 MG tablet Take 25 mg by mouth at bedtime.   Yes Historical Provider, MD  amphetamine-dextroamphetamine (ADDERALL XR) 30 MG 24 hr capsule Take 30 mg by mouth every morning.    Yes Historical Provider, MD  Aspirin-Acetaminophen (GOODY BODY PAIN) 500-325 MG PACK Take 1 Package by mouth daily as needed (Pain).    Yes Historical Provider, MD  clonazePAM (KLONOPIN) 1 MG tablet Take 1 mg by mouth 5 (five) times daily.   Yes Historical Provider, MD  ibuprofen (ADVIL,MOTRIN) 800 MG tablet Take 800 mg by  mouth 3 (three) times daily.   Yes Historical Provider, MD  Insulin Human (INSULIN PUMP) SOLN Inject 1.2 each into the skin every 2 (two) hours.   Yes Historical Provider, MD  Oxycodone HCl 10 MG TABS Take 10 mg by mouth 4 (four) times daily.   Yes Historical Provider, MD  zolpidem (AMBIEN) 10 MG tablet Take 10 mg by mouth at bedtime.   Yes Historical Provider, MD  zolpidem (AMBIEN) 10 MG tablet Take 10 mg by mouth at bedtime.   Yes Historical Provider, MD   BP 108/84  Pulse 95  Resp 16  Ht 5\' 9"  (1.753 m)  Wt 170 lb (77.111 kg)  BMI 25.09 kg/m2  SpO2 100% Physical Exam  Constitutional: She is oriented to person, place, and time. She appears well-developed and well-nourished. No distress.   HENT:  Head: Normocephalic and atraumatic.  Right Ear: Hearing normal.  Left Ear: Hearing normal.  Nose: Nose normal.  Mouth/Throat: Oropharynx is clear and moist and mucous membranes are normal.  Eyes: Conjunctivae and EOM are normal. Pupils are equal, round, and reactive to light.  Neck: Normal range of motion. Neck supple.  Cardiovascular: Regular rhythm, S1 normal and S2 normal.  Exam reveals no gallop and no friction rub.   No murmur heard. Pulmonary/Chest: Effort normal and breath sounds normal. No respiratory distress. She exhibits no tenderness.  Abdominal: Soft. Normal appearance and bowel sounds are normal. There is no hepatosplenomegaly. There is no tenderness. There is no rebound, no guarding, no tenderness at McBurney's point and negative Murphy's sign. No hernia.  Musculoskeletal: Normal range of motion.  Neurological: She is alert and oriented to person, place, and time. She has normal strength. No cranial nerve deficit or sensory deficit. Coordination normal. GCS eye subscore is 4. GCS verbal subscore is 5. GCS motor subscore is 6.  Skin: Skin is warm, dry and intact. No rash noted. No cyanosis.  Psychiatric: She has a normal mood and affect. Her behavior is normal. Thought content normal. Her speech is slurred.    ED Course  Procedures (including critical care time) Labs Review Labs Reviewed  CBC WITH DIFFERENTIAL - Abnormal; Notable for the following:    Eosinophils Relative 7 (*)    All other components within normal limits  COMPREHENSIVE METABOLIC PANEL - Abnormal; Notable for the following:    Glucose, Bld 32 (*)    Total Bilirubin 0.2 (*)    All other components within normal limits  URINE RAPID DRUG SCREEN (HOSP PERFORMED) - Abnormal; Notable for the following:    Benzodiazepines POSITIVE (*)    Amphetamines POSITIVE (*)    All other components within normal limits  CBG MONITORING, ED - Abnormal; Notable for the following:    Glucose-Capillary 24 (*)    All  other components within normal limits  CBG MONITORING, ED - Abnormal; Notable for the following:    Glucose-Capillary 175 (*)    All other components within normal limits  CBG MONITORING, ED - Abnormal; Notable for the following:    Glucose-Capillary 143 (*)    All other components within normal limits  ETHANOL    Imaging Review No results found.   EKG Interpretation None      MDM   Final diagnoses:  None   hypoglycemia  Patient presents to the ER for evaluation of mental status changes. The family was concerned that she might be exhibiting low blood sugar. Patient did report that she had been drinking yesterday evening and night, might have  passed out from drinking. She had not eaten anything yesterday or today. Patient arrived to the ER with her insulin pump in place, still on. Her initial blood sugar per EMS was 85, but she only dropped here in the ER to 24. She was given dextrose and her mental status immediately improved to her normal baseline. Her alcohol level was negative. She does have benzodiazepines and amphetamines in her urine drug screen. Patient was fed and monitored, and no recurrence of hypoglycemia. She was counseled that she needs to eat frequent meals if she is going to use her insulin pump.    Gilda Creasehristopher J. Pollina, MD 12/05/13 1755

## 2013-12-05 NOTE — ED Notes (Signed)
Pt states she is feeling better. Speech normal. Pt ambulated to restroom independently to provide urine sample. nad noted.

## 2013-12-05 NOTE — ED Notes (Signed)
Speech normal. Pt asking questions, states she does not remember getting to the ED. nad noted.

## 2013-12-05 NOTE — Discharge Instructions (Signed)
Hypoglycemia °Hypoglycemia occurs when the glucose in your blood is too low. Glucose is a type of sugar that is your body's main energy source. Hormones, such as insulin and glucagon, control the level of glucose in the blood. Insulin lowers blood glucose and glucagon increases blood glucose. Having too much insulin in your blood stream, or not eating enough food containing sugar, can result in hypoglycemia. Hypoglycemia can happen to people with or without diabetes. It can develop quickly and can be a medical emergency.  °CAUSES  °· Missing or delaying meals. °· Not eating enough carbohydrates at meals. °· Taking too much diabetes medicine. °· Not timing your oral diabetes medicine or insulin doses with meals, snacks, and exercise. °· Nausea and vomiting. °· Certain medicines. °· Severe illnesses, such as hepatitis, kidney disorders, and certain eating disorders. °· Increased activity or exercise without eating something extra or adjusting medicines. °· Drinking too much alcohol. °· A nerve disorder that affects body functions like your heart rate, blood pressure, and digestion (autonomic neuropathy). °· A condition where the stomach muscles do not function properly (gastroparesis). Therefore, medicines and food may not absorb properly. °· Rarely, a tumor of the pancreas can produce too much insulin. °SYMPTOMS  °· Hunger. °· Sweating (diaphoresis). °· Change in body temperature. °· Shakiness. °· Headache. °· Anxiety. °· Lightheadedness. °· Irritability. °· Difficulty concentrating. °· Dry mouth. °· Tingling or numbness in the hands or feet. °· Restless sleep or sleep disturbances. °· Altered speech and coordination. °· Change in mental status. °· Seizures or prolonged convulsions. °· Combativeness. °· Drowsiness (lethargic). °· Weakness. °· Increased heart rate or palpitations. °· Confusion. °· Pale, gray skin color. °· Blurred or double vision. °· Fainting. °DIAGNOSIS  °A physical exam and medical history will be  performed. Your caregiver may make a diagnosis based on your symptoms. Blood tests and other lab tests may be performed to confirm a diagnosis. Once the diagnosis is made, your caregiver will see if your signs and symptoms go away once your blood glucose is raised.  °TREATMENT  °Usually, you can easily treat your hypoglycemia when you notice symptoms. °· Check your blood glucose. If it is less than 70 mg/dl, take one of the following:   °¨ 3-4 glucose tablets.   °¨ ½ cup juice.   °¨ ½ cup regular soda.   °¨ 1 cup skim milk.   °¨ ½-1 tube of glucose gel.   °¨ 5-6 hard candies.   °· Avoid high-fat drinks or food that may delay a rise in blood glucose levels. °· Do not take more than the recommended amount of sugary foods, drinks, gel, or tablets. Doing so will cause your blood glucose to go too high.   °· Wait 10-15 minutes and recheck your blood glucose. If it is still less than 70 mg/dl or below your target range, repeat treatment.   °· Eat a snack if it is more than 1 hour until your next meal.   °There may be a time when your blood glucose may go so low that you are unable to treat yourself at home when you start to notice symptoms. You may need someone to help you. You may even faint or be unable to swallow. If you cannot treat yourself, someone will need to bring you to the hospital.  °HOME CARE INSTRUCTIONS °· If you have diabetes, follow your diabetes management plan by: °¨ Taking your medicines as directed. °¨ Following your exercise plan. °¨ Following your meal plan. Do not skip meals. Eat on time. °¨ Testing your blood   glucose regularly. Check your blood glucose before and after exercise. If you exercise longer or different than usual, be sure to check blood glucose more frequently. °¨ Wearing your medical alert jewelry that says you have diabetes. °· Identify the cause of your hypoglycemia. Then, develop ways to prevent the recurrence of hypoglycemia. °· Do not take a hot bath or shower right after an  insulin shot. °· Always carry treatment with you. Glucose tablets are the easiest to carry. °· If you are going to drink alcohol, drink it only with meals. °· Tell friends or family members ways to keep you safe during a seizure. This may include removing hard or sharp objects from the area or turning you on your side. °· Maintain a healthy weight. °SEEK MEDICAL CARE IF:  °· You are having problems keeping your blood glucose in your target range. °· You are having frequent episodes of hypoglycemia. °· You feel you might be having side effects from your medicines. °· You are not sure why your blood glucose is dropping so low. °· You notice a change in vision or a new problem with your vision. °SEEK IMMEDIATE MEDICAL CARE IF:  °· Confusion develops. °· A change in mental status occurs. °· The inability to swallow develops. °· Fainting occurs. °Document Released: 06/04/2005 Document Revised: 06/09/2013 Document Reviewed: 10/01/2011 °[employer]® Patient Information ©2015 [employer]. This information is not intended to replace advice given to you by your health care provider. Make sure you discuss any questions you have with your health care provider. ° °

## 2014-01-22 ENCOUNTER — Encounter (HOSPITAL_COMMUNITY): Payer: Self-pay | Admitting: Emergency Medicine

## 2014-01-22 ENCOUNTER — Telehealth: Payer: Self-pay | Admitting: Gastroenterology

## 2014-01-22 ENCOUNTER — Emergency Department (HOSPITAL_COMMUNITY)
Admission: EM | Admit: 2014-01-22 | Discharge: 2014-01-22 | Discharge status: Against Medical Advice | Payer: Medicaid Other | Attending: Emergency Medicine | Admitting: Emergency Medicine

## 2014-01-22 DIAGNOSIS — Z79899 Other long term (current) drug therapy: Secondary | ICD-10-CM | POA: Diagnosis not present

## 2014-01-22 DIAGNOSIS — R945 Abnormal results of liver function studies: Secondary | ICD-10-CM

## 2014-01-22 DIAGNOSIS — G40909 Epilepsy, unspecified, not intractable, without status epilepticus: Secondary | ICD-10-CM | POA: Insufficient documentation

## 2014-01-22 DIAGNOSIS — F410 Panic disorder [episodic paroxysmal anxiety] without agoraphobia: Secondary | ICD-10-CM | POA: Diagnosis not present

## 2014-01-22 DIAGNOSIS — R4182 Altered mental status, unspecified: Secondary | ICD-10-CM | POA: Diagnosis not present

## 2014-01-22 DIAGNOSIS — E162 Hypoglycemia, unspecified: Secondary | ICD-10-CM

## 2014-01-22 DIAGNOSIS — Z791 Long term (current) use of non-steroidal anti-inflammatories (NSAID): Secondary | ICD-10-CM | POA: Diagnosis not present

## 2014-01-22 DIAGNOSIS — F17200 Nicotine dependence, unspecified, uncomplicated: Secondary | ICD-10-CM | POA: Diagnosis not present

## 2014-01-22 DIAGNOSIS — G8929 Other chronic pain: Secondary | ICD-10-CM | POA: Insufficient documentation

## 2014-01-22 DIAGNOSIS — Z794 Long term (current) use of insulin: Secondary | ICD-10-CM | POA: Diagnosis not present

## 2014-01-22 DIAGNOSIS — M5137 Other intervertebral disc degeneration, lumbosacral region: Secondary | ICD-10-CM | POA: Diagnosis not present

## 2014-01-22 DIAGNOSIS — B179 Acute viral hepatitis, unspecified: Secondary | ICD-10-CM

## 2014-01-22 DIAGNOSIS — E119 Type 2 diabetes mellitus without complications: Secondary | ICD-10-CM | POA: Insufficient documentation

## 2014-01-22 DIAGNOSIS — R7989 Other specified abnormal findings of blood chemistry: Secondary | ICD-10-CM | POA: Insufficient documentation

## 2014-01-22 DIAGNOSIS — Z880 Allergy status to penicillin: Secondary | ICD-10-CM | POA: Diagnosis not present

## 2014-01-22 DIAGNOSIS — E1169 Type 2 diabetes mellitus with other specified complication: Secondary | ICD-10-CM | POA: Diagnosis not present

## 2014-01-22 DIAGNOSIS — M51379 Other intervertebral disc degeneration, lumbosacral region without mention of lumbar back pain or lower extremity pain: Secondary | ICD-10-CM | POA: Insufficient documentation

## 2014-01-22 LAB — CBC WITH DIFFERENTIAL/PLATELET
BASOS PCT: 0 % (ref 0–1)
Basophils Absolute: 0 10*3/uL (ref 0.0–0.1)
EOS ABS: 0.1 10*3/uL (ref 0.0–0.7)
EOS PCT: 2 % (ref 0–5)
HEMATOCRIT: 38.4 % (ref 36.0–46.0)
Hemoglobin: 12.8 g/dL (ref 12.0–15.0)
Lymphocytes Relative: 29 % (ref 12–46)
Lymphs Abs: 1.8 10*3/uL (ref 0.7–4.0)
MCH: 32.1 pg (ref 26.0–34.0)
MCHC: 33.3 g/dL (ref 30.0–36.0)
MCV: 96.2 fL (ref 78.0–100.0)
MONO ABS: 0.5 10*3/uL (ref 0.1–1.0)
MONOS PCT: 9 % (ref 3–12)
Neutro Abs: 3.6 10*3/uL (ref 1.7–7.7)
Neutrophils Relative %: 60 % (ref 43–77)
Platelets: 256 10*3/uL (ref 150–400)
RBC: 3.99 MIL/uL (ref 3.87–5.11)
RDW: 13.5 % (ref 11.5–15.5)
WBC: 6 10*3/uL (ref 4.0–10.5)

## 2014-01-22 LAB — COMPREHENSIVE METABOLIC PANEL
ALBUMIN: 3.4 g/dL — AB (ref 3.5–5.2)
ALT: 608 U/L — ABNORMAL HIGH (ref 0–35)
AST: 767 U/L — ABNORMAL HIGH (ref 0–37)
Alkaline Phosphatase: 281 U/L — ABNORMAL HIGH (ref 39–117)
Anion gap: 10 (ref 5–15)
BILIRUBIN TOTAL: 0.3 mg/dL (ref 0.3–1.2)
BUN: 16 mg/dL (ref 6–23)
CALCIUM: 8.8 mg/dL (ref 8.4–10.5)
CO2: 29 mEq/L (ref 19–32)
Chloride: 102 mEq/L (ref 96–112)
Creatinine, Ser: 0.92 mg/dL (ref 0.50–1.10)
GFR calc Af Amer: >90 mL/min (ref >90)
GFR calc non Af Amer: 78 mL/min — ABNORMAL LOW (ref >90)
Glucose, Bld: 21 mg/dL — CL (ref 70–99)
Potassium: 3.9 mEq/L (ref 3.7–5.3)
Sodium: 141 mEq/L (ref 137–147)
TOTAL PROTEIN: 6.7 g/dL (ref 6.0–8.3)

## 2014-01-22 LAB — SALICYLATE LEVEL

## 2014-01-22 LAB — ACETAMINOPHEN LEVEL: Acetaminophen (Tylenol), Serum: <15 ug/mL (ref 10–30)

## 2014-01-22 LAB — PROTIME-INR
INR: 0.89 (ref 0.00–1.49)
Prothrombin Time: 12 seconds (ref 11.6–15.2)

## 2014-01-22 LAB — CBG MONITORING, ED
GLUCOSE-CAPILLARY: 11 mg/dL — AB (ref 70–99)
Glucose-Capillary: 127 mg/dL — ABNORMAL HIGH (ref 70–99)

## 2014-01-22 LAB — ETHANOL

## 2014-01-22 MED ORDER — DEXTROSE 50 % IV SOLN
INTRAVENOUS | Status: AC
  Filled 2014-01-22: qty 50

## 2014-01-22 MED ORDER — DEXTROSE 50 % IV SOLN
50.0000 mL | Freq: Once | INTRAVENOUS | Status: AC
  Administered 2014-01-22: 50 mL via INTRAVENOUS

## 2014-01-22 NOTE — Discharge Instructions (Signed)
Refrain from taking medications containing Tylenol or acetaminophen.  You are to have followup liver function tests performed on Monday.  You are welcome to return to the emergency department at any time should you change your mind and desire hospitalization.  Altered Mental Status Altered mental status most often refers to an abnormal change in your responsiveness and awareness. It can affect your speech, thought, mobility, memory, attention span, or alertness. It can range from slight confusion to complete unresponsiveness (coma). Altered mental status can be a sign of a serious underlying medical condition. Rapid evaluation and medical treatment is necessary for patients having an altered mental status. CAUSES   Low blood sugar (hypoglycemia) or diabetes.  Severe loss of body fluids (dehydration) or a body salt (electrolyte) imbalance.  A stroke or other neurologic problem, such as dementia or delirium.  A head injury or tumor.  A drug or alcohol overdose.  Exposure to toxins or poisons.  Depression, anxiety, and stress.  A low oxygen level (hypoxia).  An infection.  Blood loss.  Twitching or shaking (seizure).  Heart problems, such as heart attack or heart rhythm problems (arrhythmias).  A body temperature that is too low or too high (hypothermia or hyperthermia). DIAGNOSIS  A diagnosis is based on your history, symptoms, physical and neurologic examinations, and diagnostic tests. Diagnostic tests may include:  Measurement of your blood pressure, pulse, breathing, and oxygen levels (vital signs).  Blood tests.  Urine tests.  X-ray exams.  A computerized magnetic scan (magnetic resonance imaging, MRI).  A computerized X-ray scan (computed tomography, CT scan). TREATMENT  Treatment will depend on the cause. Treatment may include:  Management of an underlying medical or mental health condition.  Critical care or support in the hospital. HOME CARE INSTRUCTIONS     Only take over-the-counter or prescription medicines for pain, discomfort, or fever as directed by your caregiver.  Manage underlying conditions as directed by your caregiver.  Eat a healthy, well-balanced diet to maintain strength.  Join a support group or prevention program to cope with the condition or trauma that caused the altered mental status. Ask your caregiver to help choose a program that works for you.  Follow up with your caregiver for further examination, therapy, or testing as directed. SEEK MEDICAL CARE IF:   You feel unwell or have chills.  You or your family notice a change in your behavior or your alertness.  You have trouble following your caregiver's treatment plan.  You have questions or concerns. SEEK IMMEDIATE MEDICAL CARE IF:   You have a rapid heartbeat or have chest pain.  You have difficulty breathing.  You have a fever.  You have a headache with a stiff neck.  You cough up blood.  You have blood in your urine or stool.  You have severe agitation or confusion. MAKE SURE YOU:   Understand these instructions.  Will watch your condition.  Will get help right away if you are not doing well or get worse. Document Released: 11/22/2009 Document Revised: 08/27/2011 Document Reviewed: 11/22/2009 [facility] Patient Information 2015 [employer], [place]. This information is not intended to replace advice given to you by your health care provider. Make sure you discuss any questions you have with your health care provider.

## 2014-01-22 NOTE — ED Notes (Signed)
Pt awake at this time eating meal tray. Oriented to situation. Family at bedside.

## 2014-01-22 NOTE — Telephone Encounter (Signed)
PT SEEN IN ED WITH ELEVATED LIVER ENZYMES. WANTS TO GO HOME. DISCUSSED WITH DR. Judd LienELO. IF PT WANTS TO GO HOME SHE SHOULD SIGN OUT AMA AND HAVE HER LIVER ENZYMES/PT/INR CHECKED MON. PENDING ACUTE HEP PANEL/PT/INR AUG 7.  CALL PT MON AM(AUG 10) TO REMIND TO GET LABS CHECKED.

## 2014-01-22 NOTE — ED Notes (Signed)
Pt left dept with understanding she is leaving AMA but she can still return if she needs to.

## 2014-01-22 NOTE — ED Notes (Signed)
CBG reported by Chestine Sporelark, NT and orders given by Judd LieneLo, MD

## 2014-01-22 NOTE — ED Notes (Signed)
Pt signed out AMA.

## 2014-01-22 NOTE — ED Notes (Signed)
Pt found by daughter in altered state, pt has an insulin pump and daughter thought her glucose was low, CBG 85 per EMS. Pt appears to be over medicated and HX of same per pt's daughter. Unsure if intent was harm or trying to get high. Pt has RX for Klonopin, and pain meds unspecified per daughter and xanax.

## 2014-01-22 NOTE — ED Provider Notes (Addendum)
CSN: [id number]     Arrival date & time 01/22/14  1440 History  This chart was scribed for Elizabeth Lyons, MD by Jarvis Morgan, ED Scribe. This patient was seen in room APA06/APA06 and the patient's care was started at 3:06 PM.    Chief Complaint  Patient presents with  . Altered Mental Status   Level 5 Caveat- Altered Mental State  The history is provided by a relative (daughter). The history is limited by the condition of the patient. No language interpreter was used.   HPI Comments: [name] OVERALL is a 38 y.o. female with a h/o panic attacks, DM, SI, DM induced seizures, and DDD, brought in by ambulance, who presents to the Emergency Department due to an AMS. Patient was found by daughter in an altered state and daughter states she immediately called EMS. Daughter states that her mother has an rx for Klonopin and she has had a previous episode of over medicating. Daughter states that pt has an insulin pump and states that this AMS is how pt acts when sugar is low. Daughter states that the patient also takes Xanax. Per EMS pt CBG was 85. Daughter is unsure is pt has been drinking any alcohol. Daughter denies any h/o intentional substance overdosing that she knows of. Daughter denies any h/o of drug use or drug abuse. Daughter states she does not live with mother.      Past Medical History  Diagnosis Date  . Panic attacks   . Diabetes mellitus   . Suicidal thoughts   . Seizures     diabetic seizures in past-last was few yrs ago  . Fibromyalgia   . DDD (degenerative disc disease), lumbar   . Chronic back pain   . Chronic shoulder pain    Past Surgical History  Procedure Laterality Date  . Cesarean section  1998    x1  . Hernia repair      bilateral inguinal hernia repairs age 31  . Laparoscopic tubal ligation  07/31/2011    Procedure: LAPAROSCOPIC TUBAL LIGATION;  Surgeon: [name], MD;  Location: [facility];  Service: Gynecology;  Laterality: Bilateral;  laparoscopic bilateral  tubal ligation with falope rings   Family History  Problem Relation Age of Onset  . Hypotension Neg Hx   . Anesthesia problems Neg Hx   . Malignant hyperthermia Neg Hx   . Pseudochol deficiency Neg Hx   . Heart disease    . Arthritis    . Cancer    . Diabetes     History  Substance Use Topics  . Smoking status: Current Every Day Smoker -- 0.50 packs/day for 20 years    Types: Cigarettes  . Smokeless tobacco: Never Used  . Alcohol Use: No     Comment: occasional   OB History   Grav Para Term Preterm Abortions TAB SAB Ect Mult Living                 Review of Systems  Unable to perform ROS: Mental status change     Allergies  Penicillins and Levofloxacin  Home Medications   Prior to Admission medications   Medication Sig Start Date End Date Taking? Authorizing Provider  ALPRAZolam Prudy Feeler) 1 MG tablet Take 1 mg by mouth 4 (four) times daily.    Historical Provider, MD  amitriptyline (ELAVIL) 25 MG tablet Take 25 mg by mouth at bedtime.    Historical Provider, MD  amphetamine-dextroamphetamine (ADDERALL XR) 30 MG 24 hr capsule Take 30  mg by mouth every morning.     Historical Provider, MD  Aspirin-Acetaminophen (GOODY BODY PAIN) 500-325 MG PACK Take 1 Package by mouth daily as needed (Pain).     Historical Provider, MD  clonazePAM (KLONOPIN) 1 MG tablet Take 1 mg by mouth 5 (five) times daily.    Historical Provider, MD  ibuprofen (ADVIL,MOTRIN) 800 MG tablet Take 800 mg by mouth 3 (three) times daily.    Historical Provider, MD  Insulin Human (INSULIN PUMP) SOLN Inject 1.2 each into the skin every 2 (two) hours.    Historical Provider, MD  Oxycodone HCl 10 MG TABS Take 10 mg by mouth 4 (four) times daily.    Historical Provider, MD  zolpidem (AMBIEN) 10 MG tablet Take 10 mg by mouth at bedtime.    Historical Provider, MD  zolpidem (AMBIEN) 10 MG tablet Take 10 mg by mouth at bedtime.    Historical Provider, MD   Triage Vitals: BP 131/88  Pulse 89  Resp 14  Ht 5\' 5"   (1.651 m)  Wt 170 lb (77.111 kg)  BMI 28.29 kg/m2  SpO2 97%  Physical Exam  Nursing note and vitals reviewed. Constitutional: She appears well-developed and well-nourished.  Pt is thrashing and appears confused. She is restless and uncooperative.   HENT:  Head: Normocephalic and atraumatic.  Eyes: Conjunctivae and EOM are normal. Pupils are equal, round, and reactive to light.  Neck: Neck supple. No tracheal deviation present.  Cardiovascular: Normal rate, regular rhythm and normal heart sounds.   Pulmonary/Chest: Effort normal. No respiratory distress.  Abdominal: Soft. Bowel sounds are normal. She exhibits no distension and no mass. There is no tenderness. There is no rebound and no guarding.  Musculoskeletal: Normal range of motion.  Neurological:  She is awake and thrashing. Neurologic exam is difficult secondary to this, however, she moves all extremities with purpose  Skin: Skin is warm and dry.  Psychiatric: She has a normal mood and affect. Her behavior is normal.    ED Course  Procedures (including critical care time)  DIAGNOSTIC STUDIES: Oxygen Saturation is 97% on RA, normal by my interpretation.    COORDINATION OF CARE: 3:15 PM- Will order EKG and diagnostic lab workup. Pt advised of plan for treatment and pt agrees.   Labs Review Labs Reviewed - No data to display  Imaging Review No results found.   EKG Interpretation   Date/Time:  Friday January 22 2014 14:53:24 EDT Ventricular Rate:  93 PR Interval:  136 QRS Duration: 102 QT Interval:  376 QTC Calculation: 468 R Axis:   -6 Text Interpretation:  Sinus rhythm Probable left atrial enlargement  Confirmed by Malva CoganELOS  MD, Damiean Lukes ([id number]) on 01/22/2014 7:10:47 PM      MDM   Final diagnoses:  None    Patient is a 38 year old female brought for altered mental status. She has a history of diabetes and her daughter states this is how she ask when her blood sugar drops. Her sugar was checked in the field was  found to be 85 by EMS. She was brought here with continuing altered mental status. Her blood sugar was rechecked in the ER and found to be 11. She was then given D50 and promptly became awake, alert, and appropriate. She was then given a meal tray which she consumed without difficulty. Recheck blood sugar was 124.  Workup reveals significantly elevated liver functions, but normal electrolytes. Her Tylenol level is undetectable and alcohol level is undetectable as well. The patient admits to  me that she takes Percocet daily and will occasionally take additional Tylenol, however denies taking a large ingestion or intentionally overdosing. I spoke with Dr. Darrick Penna from gastroenterology regarding the liver function elevations. She has recommended the patient be admitted, however the patient is refusing to stay. She will sign out AGAINST MEDICAL ADVICE. I will make arrangements for her liver functions to be repeated on Monday and she can follow these results up with her primary Dr.  She understands she can return at any time should she change her mind and desire hospitalization.  I personally performed the services described in this documentation, which was scribed in my presence. The recorded information has been reviewed and is accurate.       Elizabeth Lyons, MD 01/22/14 1656  Elizabeth Lyons, MD 01/22/14 1911

## 2014-01-23 LAB — HEPATITIS PANEL, ACUTE
HCV AB: NEGATIVE
Hep A IgM: NONREACTIVE
Hep B C IgM: NONREACTIVE
Hepatitis B Surface Ag: NEGATIVE

## 2014-01-26 ENCOUNTER — Other Ambulatory Visit: Payer: Self-pay

## 2014-01-26 DIAGNOSIS — R748 Abnormal levels of other serum enzymes: Secondary | ICD-10-CM

## 2014-01-26 NOTE — Telephone Encounter (Signed)
Lab orders faxed to [facility].  

## 2014-02-01 NOTE — Telephone Encounter (Signed)
I called pt and told her to go to the lab today to get bloodwork, and make sure to do so because Dr. Darrick PennaFields wants this right away.

## 2014-04-23 ENCOUNTER — Emergency Department (HOSPITAL_COMMUNITY)
Admission: EM | Admit: 2014-04-23 | Discharge: 2014-04-23 | Disposition: A | Discharge status: Home / Self Care | Payer: Medicaid Other | Attending: Emergency Medicine | Admitting: Emergency Medicine

## 2014-04-23 ENCOUNTER — Encounter (HOSPITAL_COMMUNITY): Payer: Self-pay | Admitting: Emergency Medicine

## 2014-04-23 DIAGNOSIS — Z880 Allergy status to penicillin: Secondary | ICD-10-CM | POA: Insufficient documentation

## 2014-04-23 DIAGNOSIS — R350 Frequency of micturition: Secondary | ICD-10-CM | POA: Insufficient documentation

## 2014-04-23 DIAGNOSIS — Z794 Long term (current) use of insulin: Secondary | ICD-10-CM | POA: Insufficient documentation

## 2014-04-23 DIAGNOSIS — G8929 Other chronic pain: Secondary | ICD-10-CM | POA: Insufficient documentation

## 2014-04-23 DIAGNOSIS — E1140 Type 2 diabetes mellitus with diabetic neuropathy, unspecified: Secondary | ICD-10-CM | POA: Insufficient documentation

## 2014-04-23 DIAGNOSIS — Z791 Long term (current) use of non-steroidal anti-inflammatories (NSAID): Secondary | ICD-10-CM | POA: Insufficient documentation

## 2014-04-23 DIAGNOSIS — M5136 Other intervertebral disc degeneration, lumbar region: Secondary | ICD-10-CM | POA: Insufficient documentation

## 2014-04-23 DIAGNOSIS — F410 Panic disorder [episodic paroxysmal anxiety] without agoraphobia: Secondary | ICD-10-CM | POA: Insufficient documentation

## 2014-04-23 DIAGNOSIS — Z720 Tobacco use: Secondary | ICD-10-CM | POA: Insufficient documentation

## 2014-04-23 DIAGNOSIS — Z79899 Other long term (current) drug therapy: Secondary | ICD-10-CM | POA: Insufficient documentation

## 2014-04-23 DIAGNOSIS — G43909 Migraine, unspecified, not intractable, without status migrainosus: Secondary | ICD-10-CM | POA: Insufficient documentation

## 2014-04-23 DIAGNOSIS — M79622 Pain in left upper arm: Secondary | ICD-10-CM | POA: Insufficient documentation

## 2014-04-23 LAB — URINALYSIS, ROUTINE W REFLEX MICROSCOPIC
BILIRUBIN URINE: NEGATIVE
Glucose, UA: NEGATIVE mg/dL
Hgb urine dipstick: NEGATIVE
Ketones, ur: NEGATIVE mg/dL
Leukocytes, UA: NEGATIVE
Nitrite: NEGATIVE
Protein, ur: NEGATIVE mg/dL
SPECIFIC GRAVITY, URINE: 1.02 (ref 1.005–1.030)
UROBILINOGEN UA: 0.2 mg/dL (ref 0.0–1.0)
pH: 6 (ref 5.0–8.0)

## 2014-04-23 MED ORDER — SULFAMETHOXAZOLE-TRIMETHOPRIM 800-160 MG PO TABS: 1.0000 | ORAL_TABLET | Freq: Two times a day (BID) | ORAL | Status: DC

## 2014-04-23 NOTE — ED Notes (Signed)
PT c/o cyst to her left axilla that has became painful and larger over the past week. PT also c/o worsening in chronic back pain.

## 2014-04-23 NOTE — ED Notes (Addendum)
Fluctuant area in L axilla, approx. 3 cm diameter, tender to palpation.  No erythema or warmth.  States she has had abcess lanced in this same spot and has had intermittent problems with same swelling since then.  Patient states she has also had urinary frequency, hesitancy and burning/stinging w/urinations x 2 weeks.  She has not been evaluated for UTI, but states her blood sugars have been elevated d/t an air bubble and crimp in her insulin pump.  She has recently been using her insulin pen.

## 2014-04-23 NOTE — ED Notes (Signed)
Patient with no complaints at this time. Respirations even and unlabored. Skin warm/dry. Discharge instructions reviewed with patient at this time. Patient given opportunity to voice concerns/ask questions. Patient discharged at this time and left Emergency Department with steady gait.   

## 2014-04-25 NOTE — ED Provider Notes (Signed)
CSN: [id number]     Arrival date & time 04/23/14  1148 History   First MD Initiated Contact with Patient 04/23/14 1249     Chief Complaint  Patient presents with  . Cyst     (Consider location/radiation/quality/duration/timing/severity/associated sxs/prior Treatment) HPI   [name] is a 38 y.o. female who presents to the Emergency Department complaining of pain and swelling to the left axilla.  She reports recurrent episodes of this for months.  symptoms have been present this time for one week.  She states she has been evaluated by her PMD for same, but symptoms usually improve or resolve by the time of her doctors appt.  She denies fever, redness, drainage or chills.  Patient also requesting a urine test to check for a UTI.  She states that she has had increased urination and frequency with intermittent burning sensation for 2 weeks, but she also states that the tubing on her insulin pump became crimped and she had a brief episode of elevated blood sugar that have now normalized, but she wants to make sure her symptoms weren't from a UTI.     Past Medical History  Diagnosis Date  . Panic attacks   . Diabetes mellitus   . Suicidal thoughts   . Seizures     diabetic seizures in past-last was few yrs ago  . Fibromyalgia   . DDD (degenerative disc disease), lumbar   . Chronic back pain   . Chronic shoulder pain    Past Surgical History  Procedure Laterality Date  . Cesarean section  1998    x1  . Hernia repair      bilateral inguinal hernia repairs age 89  . Laparoscopic tubal ligation  07/31/2011    Procedure: LAPAROSCOPIC TUBAL LIGATION;  Surgeon: Tilda BurrowJohn V Ferguson, MD;  Location: [facility];  Service: Gynecology;  Laterality: Bilateral;  laparoscopic bilateral tubal ligation with falope rings   Family History  Problem Relation Age of Onset  . Hypotension Neg Hx   . Anesthesia problems Neg Hx   . Malignant hyperthermia Neg Hx   . Pseudochol deficiency Neg Hx   . Heart disease     . Arthritis    . Cancer    . Diabetes     History  Substance Use Topics  . Smoking status: Current Every Day Smoker -- 0.50 packs/day for 20 years    Types: Cigarettes  . Smokeless tobacco: Never Used  . Alcohol Use: No     Comment: occasional   OB History    No data available     Review of Systems  Constitutional: Negative for fever, chills, activity change and appetite change.  Respiratory: Negative for chest tightness and shortness of breath.   Cardiovascular: Negative for chest pain.  Gastrointestinal: Negative for nausea, vomiting and abdominal pain.  Genitourinary: Positive for frequency. Negative for dysuria, hematuria, flank pain, vaginal bleeding, vaginal discharge, difficulty urinating and vaginal pain.  Musculoskeletal: Negative for joint swelling and arthralgias.  Skin:       Tenderness and swelling to the left axilla  Hematological: Negative for adenopathy.  All other systems reviewed and are negative.     Allergies  Penicillins and Levofloxacin  Home Medications   Prior to Admission medications   Medication Sig Start Date End Date Taking? Authorizing Provider  amitriptyline (ELAVIL) 25 MG tablet Take 25 mg by mouth at bedtime.    Historical Provider, MD  amphetamine-dextroamphetamine (ADDERALL) 20 MG tablet Take 20 mg by mouth 2 (  two) times daily.    Historical Provider, MD  Aspirin-Acetaminophen (GOODY BODY PAIN) 500-325 MG PACK Take 1 Package by mouth daily as needed (Pain).     Historical Provider, MD  clonazePAM (KLONOPIN) 1 MG tablet Take 1 mg by mouth 2 (two) times daily as needed for anxiety.     Historical Provider, MD  gabapentin (NEURONTIN) 100 MG capsule Take 100 mg by mouth 3 (three) times daily.    Historical Provider, MD  ibuprofen (ADVIL,MOTRIN) 800 MG tablet Take 800 mg by mouth 3 (three) times daily.    Historical Provider, MD  Insulin Human (INSULIN PUMP) SOLN Inject 1.2 each into the skin every 2 (two) hours.    Historical Provider, MD   Oxycodone HCl 10 MG TABS Take 10 mg by mouth 4 (four) times daily.    Historical Provider, MD  sulfamethoxazole-trimethoprim (SEPTRA DS) 800-160 MG per tablet Take 1 tablet by mouth 2 (two) times daily. For 10 days 04/23/14   Elizabeth Majette L. Anetra Czerwinski, PA-C  traZODone (DESYREL) 50 MG tablet Take 150 mg by mouth at bedtime.    Historical Provider, MD  zolpidem (AMBIEN) 10 MG tablet Take 10 mg by mouth at bedtime.    Historical Provider, MD   BP 106/72 mmHg  Pulse 98  Temp(Src) 98.6 F (37 C) (Oral)  Resp 18  Ht 5\' 2"  (1.575 m)  Wt 160 lb (72.576 kg)  BMI 29.26 kg/m2  SpO2 99%  LMP 04/09/2014 Physical Exam  Constitutional: She is oriented to person, place, and time. She appears well-developed and well-nourished. No distress.  HENT:  Head: Normocephalic and atraumatic.  Cardiovascular: Normal rate, regular rhythm and normal heart sounds.   No murmur heard. Pulmonary/Chest: Effort normal and breath sounds normal. No respiratory distress.  Musculoskeletal: Normal range of motion.  Neurological: She is alert and oriented to person, place, and time. She exhibits normal muscle tone. Coordination normal.  Skin: Skin is warm and dry. There is erythema.  3-4 cm flesh colored, fluctuant area of left axilla.  Ttp.  No induration, drainage or lymphadenopathy.  No surrounding erythema  Nursing note and vitals reviewed.   ED Course  Procedures (including critical care time) Labs Review Labs Reviewed  URINALYSIS, ROUTINE W REFLEX MICROSCOPIC - Abnormal; Notable for the following:    Color, Urine ORANGE (*)    APPearance HAZY (*)    All other components within normal limits  URINE CULTURE    Imaging Review No results found.   EKG Interpretation None      MDM   Final diagnoses:  Axillary pain, left   Pt is well appearing.  VSS.  No concerning sx's for DKA.  Urine unremarkable, culture ordered.  No appreciable abscess on exam. No axillary lymphadenopathy. Patient reports history of frequent  abscesses to this area, so I will prescribe Septra she agrees to close follow-up with her primary care doctor. She appears stable for discharge.    Elizabeth Vitug L. Trisha Mangleriplett, PA-C 04/25/14 [id number]  Doug SouSam Jacubowitz, MD 04/26/14 [phone]

## 2014-04-27 LAB — URINE CULTURE

## 2014-04-28 ENCOUNTER — Telehealth (HOSPITAL_BASED_OUTPATIENT_CLINIC_OR_DEPARTMENT_OTHER): Payer: Self-pay | Admitting: Emergency Medicine

## 2014-04-28 NOTE — Telephone Encounter (Signed)
Post ED Visit - Positive Culture Follow-up  Culture report reviewed by antimicrobial stewardship pharmacist: []  Wes Dulaney, Pharm.D., BCPS []  Celedonio MiyamotoJeremy Frens, Pharm.D., BCPS []  Georgina PillionElizabeth Martin, [address] BoulevardPharm.D., BCPS []  Roxborough ParkMinh Pham, [place]arm.D., BCPS, AAHIVP []  Estella HuskMichelle Turner, Pharm.D., BCPS, AAHIVP [x]  Babs BertinHaley Baird, Pharm.D.    Positive urine culture E. Coli Treated with sulfamethoxazole-trimethoprim, organism sensitive to the same and no further patient follow-up is required at this time.  Berle MullMiller, Tiffiny Worthy 04/28/2014, 4:36 PM

## 2014-07-30 ENCOUNTER — Encounter (HOSPITAL_COMMUNITY): Payer: Self-pay

## 2014-07-30 ENCOUNTER — Emergency Department (HOSPITAL_COMMUNITY)
Admission: EM | Admit: 2014-07-30 | Discharge: 2014-07-31 | Disposition: A | Discharge status: Home / Self Care | Payer: Self-pay | Attending: Emergency Medicine | Admitting: Emergency Medicine

## 2014-07-30 DIAGNOSIS — G8929 Other chronic pain: Secondary | ICD-10-CM | POA: Insufficient documentation

## 2014-07-30 DIAGNOSIS — Z720 Tobacco use: Secondary | ICD-10-CM | POA: Insufficient documentation

## 2014-07-30 DIAGNOSIS — R112 Nausea with vomiting, unspecified: Secondary | ICD-10-CM

## 2014-07-30 DIAGNOSIS — L818 Other specified disorders of pigmentation: Secondary | ICD-10-CM | POA: Insufficient documentation

## 2014-07-30 DIAGNOSIS — E119 Type 2 diabetes mellitus without complications: Secondary | ICD-10-CM | POA: Insufficient documentation

## 2014-07-30 DIAGNOSIS — Z880 Allergy status to penicillin: Secondary | ICD-10-CM | POA: Insufficient documentation

## 2014-07-30 DIAGNOSIS — R197 Diarrhea, unspecified: Secondary | ICD-10-CM | POA: Insufficient documentation

## 2014-07-30 DIAGNOSIS — R1013 Epigastric pain: Secondary | ICD-10-CM

## 2014-07-30 DIAGNOSIS — Z79899 Other long term (current) drug therapy: Secondary | ICD-10-CM | POA: Insufficient documentation

## 2014-07-30 DIAGNOSIS — Z794 Long term (current) use of insulin: Secondary | ICD-10-CM | POA: Insufficient documentation

## 2014-07-30 DIAGNOSIS — F410 Panic disorder [episodic paroxysmal anxiety] without agoraphobia: Secondary | ICD-10-CM | POA: Insufficient documentation

## 2014-07-30 DIAGNOSIS — Z792 Long term (current) use of antibiotics: Secondary | ICD-10-CM | POA: Insufficient documentation

## 2014-07-30 DIAGNOSIS — Z9851 Tubal ligation status: Secondary | ICD-10-CM | POA: Insufficient documentation

## 2014-07-30 DIAGNOSIS — Z8739 Personal history of other diseases of the musculoskeletal system and connective tissue: Secondary | ICD-10-CM | POA: Insufficient documentation

## 2014-07-30 DIAGNOSIS — G40909 Epilepsy, unspecified, not intractable, without status epilepticus: Secondary | ICD-10-CM | POA: Insufficient documentation

## 2014-07-30 DIAGNOSIS — E860 Dehydration: Secondary | ICD-10-CM

## 2014-07-30 NOTE — ED Provider Notes (Signed)
CSN: [id number]     Arrival date & time 07/30/14  2344 History  This chart was scribed for [name], MD by Evon Slack, ED Scribe. This patient was seen in room [facility] and the patient's care was started at 11:51 PM.    Chief Complaint  Patient presents with  . Abdominal Pain   HPI HPI Comments: [name] is a 39 y.o. female who presents to the Emergency Department complaining of constant sharp aching abdominal pain onset today at 6 PM. She has had nausea for a couple of days. Pt states she has associated nausea, vomiting x6-10 and diarrhea once yesterday and once today. Pt states she is intermittently dizzy as well. Pt states she has forced her self to vomit tonight. Pt states that when she swallows something she feels as if nothing is going down. Denies fever, chills or other related fevers. She feels dizzy and light headed. Pt has HX of diabetes and states that her blood sugar level has been controlled around 136 tonight about 6 pm and 208 about 30 minutes ago. Pt states she had similar pain when she had ketoacidosis a year ago but states that pain was slightly worse. Pt states she smokes 0 .5 packs per day.   PCP Dr Sudie Bailey  Past Medical History  Diagnosis Date  . Panic attacks   . Diabetes mellitus   . Suicidal thoughts   . Seizures     diabetic seizures in past-last was few yrs ago  . Fibromyalgia   . DDD (degenerative disc disease), lumbar   . Chronic back pain   . Chronic shoulder pain    Past Surgical History  Procedure Laterality Date  . Cesarean section  1998    x1  . Hernia repair      bilateral inguinal hernia repairs age 89  . Laparoscopic tubal ligation  07/31/2011    Procedure: LAPAROSCOPIC TUBAL LIGATION;  Surgeon: [name], MD;  Location: [facility];  Service: Gynecology;  Laterality: Bilateral;  laparoscopic bilateral tubal ligation with falope rings   Family History  Problem Relation Age of Onset  . Hypotension Neg Hx   . Anesthesia problems  Neg Hx   . Malignant hyperthermia Neg Hx   . Pseudochol deficiency Neg Hx   . Heart disease    . Arthritis    . Cancer    . Diabetes     History  Substance Use Topics  . Smoking status: Current Every Day Smoker -- 0.50 packs/day for 20 years    Types: Cigarettes  . Smokeless tobacco: Never Used  . Alcohol Use: No     Comment: occasional   unemployed Not on disability  OB History    No data available     Review of Systems  Constitutional: Negative for fever and chills.  Gastrointestinal: Positive for nausea, vomiting, abdominal pain and diarrhea.  Neurological: Positive for dizziness.  All other systems reviewed and are negative.     Allergies  Penicillins and Levofloxacin  Home Medications   Prior to Admission medications   Medication Sig Start Date End Date Taking? Authorizing Provider  Insulin Human (INSULIN PUMP) SOLN Inject 1.2 each into the skin every 2 (two) hours.   Yes Historical Provider, MD  Oxycodone HCl 10 MG TABS Take 10 mg by mouth 4 (four) times daily.   Yes Historical Provider, MD  pregabalin (LYRICA) 100 MG capsule Take 100 mg by mouth 2 (two) times daily.   Yes Historical Provider, MD  traZODone (DESYREL) 50 MG tablet Take 150 mg by mouth at bedtime.   Yes Historical Provider, MD  amitriptyline (ELAVIL) 25 MG tablet Take 25 mg by mouth at bedtime.    Historical Provider, MD  amphetamine-dextroamphetamine (ADDERALL) 20 MG tablet Take 20 mg by mouth 2 (two) times daily.    Historical Provider, MD  Aspirin-Acetaminophen (GOODY BODY PAIN) 500-325 MG PACK Take 1 Package by mouth daily as needed (Pain).     Historical Provider, MD  clonazePAM (KLONOPIN) 1 MG tablet Take 1 mg by mouth 2 (two) times daily as needed for anxiety.     Historical Provider, MD  gabapentin (NEURONTIN) 100 MG capsule Take 100 mg by mouth 3 (three) times daily.    Historical Provider, MD  ibuprofen (ADVIL,MOTRIN) 800 MG tablet Take 800 mg by mouth 3 (three) times daily.    Historical  Provider, MD  sulfamethoxazole-trimethoprim (SEPTRA DS) 800-160 MG per tablet Take 1 tablet by mouth 2 (two) times daily. For 10 days 04/23/14   Tammy L. Triplett, PA-C  zolpidem (AMBIEN) 10 MG tablet Take 10 mg by mouth at bedtime.    Historical Provider, MD   BP 147/82 mmHg  Pulse 90  Temp(Src) 97.8 F (36.6 C) (Oral)  Resp 19  Ht 5\' 3"  (1.6 m)  Wt 160 lb (72.576 kg)  BMI 28.35 kg/m2  SpO2 99%  LMP 07/25/2014 (Approximate)  Vital signs normal     Physical Exam  Constitutional: She is oriented to person, place, and time. She appears well-developed and well-nourished.  Non-toxic appearance. She does not appear ill. No distress.  Dramatic.  HENT:  Head: Normocephalic and atraumatic.  Right Ear: External ear normal.  Left Ear: External ear normal.  Nose: Nose normal. No mucosal edema or rhinorrhea.  Mouth/Throat: Oropharynx is clear and moist and mucous membranes are normal. No dental abscesses or uvula swelling.  Eyes: Conjunctivae and EOM are normal. Pupils are equal, round, and reactive to light.  Neck: Normal range of motion and full passive range of motion without pain. Neck supple.  Cardiovascular: Normal rate, regular rhythm and normal heart sounds.  Exam reveals no gallop and no friction rub.   No murmur heard. Pulmonary/Chest: Effort normal and breath sounds normal. No respiratory distress. She has no wheezes. She has no rhonchi. She has no rales. She exhibits no tenderness and no crepitus.  Abdominal: Soft. Normal appearance and bowel sounds are normal. She exhibits no distension. There is tenderness in the epigastric area. There is no rebound and no guarding.    Self inflicted wound scars shown  Musculoskeletal: Normal range of motion. She exhibits no edema or tenderness.  Moves all extremities well.   Neurological: She is alert and oriented to person, place, and time. She has normal strength. No cranial nerve deficit.  Skin: Skin is warm, dry and intact. No rash noted.  No erythema. No pallor.  tatooes  Psychiatric: She has a normal mood and affect. Her speech is normal and behavior is normal. Her mood appears not anxious.  Nursing note and vitals reviewed.   ED Course  Procedures (including critical care time)  Medications  0.9 %  sodium chloride infusion (0 mLs Intravenous Stopped 07/31/14 0140)    Followed by  0.9 %  sodium chloride infusion (0 mLs Intravenous Stopped 07/31/14 0306)    Followed by  0.9 %  sodium chloride infusion (not administered)  ondansetron (ZOFRAN) injection 4 mg (not administered)  metoCLOPramide (REGLAN) injection 10 mg (10 mg Intravenous Given  07/31/14 0036)  diphenhydrAMINE (BENADRYL) injection 25 mg (25 mg Intravenous Given 07/31/14 0037)  fentaNYL (SUBLIMAZE) injection 50 mcg (50 mcg Intravenous Given 07/31/14 0054)  sodium chloride 0.9 % bolus 1,000 mL (1,000 mLs Intravenous New Bag/Given 07/31/14 0306)  LORazepam (ATIVAN) injection 1 mg (1 mg Intravenous Given 07/31/14 0211)  ondansetron (ZOFRAN) injection 4 mg (4 mg Intramuscular Given 07/31/14 0211)    DIAGNOSTIC STUDIES: Oxygen Saturation is 99% on RA, normal by my interpretation.    COORDINATION OF CARE: 12:16 AM-Discussed treatment plan with pt at bedside and pt agreed to plan.    01:20 pt is calmer, still c/o abdominal pain. Reviewed her test results with her. More IV fluids ordered.   03:15 resting quietly, getting her 3rd liter of NS, has had UO x 2, tongue still feels dry. Will most likely be able to be discharged after her 3rd bolus. Pt states she is "afraid" of what will happen when she goes home. I have explained to her at this time she does not meet criteria for admission. Pt will be given a dose of zofran again prior to leaving the ED.   Review of the [place] database shows patient got #120 oxycodone 10/325 on February 2, #90 alprazolam 1 mg tablets on February 2, and 60 Adderall 20 mg tablets on February 3. These are prescribed monthly by her  PCP.  Labs Review  Results for orders placed or performed during the hospital encounter of 07/30/14  Comprehensive metabolic panel  Result Value Ref Range   Sodium 138 135 - 145 mmol/L   Potassium 3.8 3.5 - 5.1 mmol/L   Chloride 108 96 - 112 mmol/L   CO2 20 19 - 32 mmol/L   Glucose, Bld 236 (H) 70 - 99 mg/dL   BUN 18 6 - 23 mg/dL   Creatinine, Ser 1.61 0.50 - 1.10 mg/dL   Calcium 8.9 8.4 - 09.6 mg/dL   Total Protein 7.8 6.0 - 8.3 g/dL   Albumin 4.4 3.5 - 5.2 g/dL   AST 20 0 - 37 U/L   ALT 32 0 - 35 U/L   Alkaline Phosphatase 102 39 - 117 U/L   Total Bilirubin 0.7 0.3 - 1.2 mg/dL   GFR calc non Af Amer 88 (L) >90 mL/min   GFR calc Af Amer >90 >90 mL/min   Anion gap 10 5 - 15  CBC with Differential  Result Value Ref Range   WBC 20.6 (H) 4.0 - 10.5 K/uL   RBC 4.21 3.87 - 5.11 MIL/uL   Hemoglobin 14.1 12.0 - 15.0 g/dL   HCT 04.5 40.9 - 81.1 %   MCV 98.1 78.0 - 100.0 fL   MCH 33.5 26.0 - 34.0 pg   MCHC 34.1 30.0 - 36.0 g/dL   RDW 91.4 78.2 - 95.6 %   Platelets 303 150 - 400 K/uL   Neutrophils Relative % 85 (H) 43 - 77 %   Neutro Abs 17.3 (H) 1.7 - 7.7 K/uL   Lymphocytes Relative 11 (L) 12 - 46 %   Lymphs Abs 2.3 0.7 - 4.0 K/uL   Monocytes Relative 4 3 - 12 %   Monocytes Absolute 0.9 0.1 - 1.0 K/uL   Eosinophils Relative 0 0 - 5 %   Eosinophils Absolute 0.1 0.0 - 0.7 K/uL   Basophils Relative 0 0 - 1 %   Basophils Absolute 0.0 0.0 - 0.1 K/uL  Lipase, blood  Result Value Ref Range   Lipase 21 11 - 59 U/L  Urinalysis, Routine w reflex microscopic  Result Value Ref Range   Color, Urine YELLOW YELLOW   APPearance CLEAR CLEAR   Specific Gravity, Urine >1.030 (H) 1.005 - 1.030   pH 5.5 5.0 - 8.0   Glucose, UA NEGATIVE NEGATIVE mg/dL   Hgb urine dipstick SMALL (A) NEGATIVE   Bilirubin Urine NEGATIVE NEGATIVE   Ketones, ur >80 (A) NEGATIVE mg/dL   Protein, ur NEGATIVE NEGATIVE mg/dL   Urobilinogen, UA 0.2 0.0 - 1.0 mg/dL   Nitrite NEGATIVE NEGATIVE   Leukocytes, UA  NEGATIVE NEGATIVE  Urine rapid drug screen (hosp performed)  Result Value Ref Range   Opiates NONE DETECTED NONE DETECTED   Cocaine NONE DETECTED NONE DETECTED   Benzodiazepines POSITIVE (A) NONE DETECTED   Amphetamines NONE DETECTED NONE DETECTED   Tetrahydrocannabinol NONE DETECTED NONE DETECTED   Barbiturates NONE DETECTED NONE DETECTED  Urine microscopic-add on  Result Value Ref Range   Squamous Epithelial / LPF MANY (A) RARE   WBC, UA 0-2 <3 WBC/hpf   RBC / HPF 0-2 <3 RBC/hpf   Bacteria, UA FEW (A) RARE   Laboratory interpretation all normal except concentrated urine c/w dehydration, leukocytosis c/w vomiting and retching, UDS shows no narcotics so she may have element of withdrawal with her symptoms, hyperglycemia without acidosis   Imaging Review No results found.   EKG Interpretation None      MDM   Final diagnoses:  Epigastric pain  Nausea and vomiting, vomiting of unspecified type  Dehydration   New Prescriptions   ONDANSETRON (ZOFRAN) 4 MG TABLET    Take 1 tablet (4 mg total) by mouth every 8 (eight) hours as needed.    Plan discharge  Devoria Albe, MD, FACEP    I personally performed the services described in this documentation, which was scribed in my presence. The recorded information has been reviewed and considered.  Devoria Albe, MD, FACEP      [name], MD 07/31/14 [phone]

## 2014-07-30 NOTE — ED Notes (Signed)
Pt reports abd pain since 6 pm with vomiting and diarrhea.

## 2014-07-31 LAB — URINALYSIS, ROUTINE W REFLEX MICROSCOPIC
BILIRUBIN URINE: NEGATIVE
GLUCOSE, UA: NEGATIVE mg/dL
Ketones, ur: >80 mg/dL — AB
Leukocytes, UA: NEGATIVE
Nitrite: NEGATIVE
PROTEIN: NEGATIVE mg/dL
Specific Gravity, Urine: >1.03 — ABNORMAL HIGH (ref 1.005–1.030)
UROBILINOGEN UA: 0.2 mg/dL (ref 0.0–1.0)
pH: 5.5 (ref 5.0–8.0)

## 2014-07-31 LAB — CBC WITH DIFFERENTIAL/PLATELET
BASOS PCT: 0 % (ref 0–1)
Basophils Absolute: 0 10*3/uL (ref 0.0–0.1)
EOS PCT: 0 % (ref 0–5)
Eosinophils Absolute: 0.1 10*3/uL (ref 0.0–0.7)
HEMATOCRIT: 41.3 % (ref 36.0–46.0)
Hemoglobin: 14.1 g/dL (ref 12.0–15.0)
Lymphocytes Relative: 11 % — ABNORMAL LOW (ref 12–46)
Lymphs Abs: 2.3 10*3/uL (ref 0.7–4.0)
MCH: 33.5 pg (ref 26.0–34.0)
MCHC: 34.1 g/dL (ref 30.0–36.0)
MCV: 98.1 fL (ref 78.0–100.0)
MONO ABS: 0.9 10*3/uL (ref 0.1–1.0)
MONOS PCT: 4 % (ref 3–12)
Neutro Abs: 17.3 10*3/uL — ABNORMAL HIGH (ref 1.7–7.7)
Neutrophils Relative %: 85 % — ABNORMAL HIGH (ref 43–77)
Platelets: 303 10*3/uL (ref 150–400)
RBC: 4.21 MIL/uL (ref 3.87–5.11)
RDW: 12.9 % (ref 11.5–15.5)
WBC: 20.6 10*3/uL — ABNORMAL HIGH (ref 4.0–10.5)

## 2014-07-31 LAB — COMPREHENSIVE METABOLIC PANEL
ALBUMIN: 4.4 g/dL (ref 3.5–5.2)
ALK PHOS: 102 U/L (ref 39–117)
ALT: 32 U/L (ref 0–35)
AST: 20 U/L (ref 0–37)
Anion gap: 10 (ref 5–15)
BILIRUBIN TOTAL: 0.7 mg/dL (ref 0.3–1.2)
BUN: 18 mg/dL (ref 6–23)
CHLORIDE: 108 mmol/L (ref 96–112)
CO2: 20 mmol/L (ref 19–32)
Calcium: 8.9 mg/dL (ref 8.4–10.5)
Creatinine, Ser: 0.83 mg/dL (ref 0.50–1.10)
GFR calc Af Amer: >90 mL/min (ref >90)
GFR calc non Af Amer: 88 mL/min — ABNORMAL LOW (ref >90)
Glucose, Bld: 236 mg/dL — ABNORMAL HIGH (ref 70–99)
Potassium: 3.8 mmol/L (ref 3.5–5.1)
SODIUM: 138 mmol/L (ref 135–145)
Total Protein: 7.8 g/dL (ref 6.0–8.3)

## 2014-07-31 LAB — URINE MICROSCOPIC-ADD ON

## 2014-07-31 LAB — RAPID URINE DRUG SCREEN, HOSP PERFORMED
AMPHETAMINES: NOT DETECTED
BARBITURATES: NOT DETECTED
Benzodiazepines: POSITIVE — AB
Cocaine: NOT DETECTED
Opiates: NOT DETECTED
Tetrahydrocannabinol: NOT DETECTED

## 2014-07-31 LAB — LIPASE, BLOOD: Lipase: 21 U/L (ref 11–59)

## 2014-07-31 MED ORDER — SODIUM CHLORIDE 0.9 % IV BOLUS (SEPSIS)
1000.0000 mL | Freq: Once | INTRAVENOUS | Status: AC
  Administered 2014-07-31: 1000 mL via INTRAVENOUS

## 2014-07-31 MED ORDER — SODIUM CHLORIDE 0.9 % IV SOLN
1000.0000 mL | Freq: Once | INTRAVENOUS | Status: AC
  Administered 2014-07-31: 1000 mL via INTRAVENOUS

## 2014-07-31 MED ORDER — METOCLOPRAMIDE HCL 5 MG/ML IJ SOLN
10.0000 mg | Freq: Once | INTRAMUSCULAR | Status: AC
  Administered 2014-07-31: 10 mg via INTRAVENOUS
  Filled 2014-07-31: qty 2

## 2014-07-31 MED ORDER — ONDANSETRON HCL 4 MG/2ML IJ SOLN
4.0000 mg | Freq: Once | INTRAMUSCULAR | Status: AC
  Administered 2014-07-31: 4 mg via INTRAMUSCULAR
  Filled 2014-07-31: qty 2

## 2014-07-31 MED ORDER — DIPHENHYDRAMINE HCL 50 MG/ML IJ SOLN
25.0000 mg | Freq: Once | INTRAMUSCULAR | Status: AC
  Administered 2014-07-31: 25 mg via INTRAVENOUS
  Filled 2014-07-31: qty 1

## 2014-07-31 MED ORDER — FENTANYL CITRATE 0.05 MG/ML IJ SOLN
50.0000 ug | Freq: Once | INTRAMUSCULAR | Status: AC
  Administered 2014-07-31: 50 ug via INTRAVENOUS
  Filled 2014-07-31: qty 2

## 2014-07-31 MED ORDER — LORAZEPAM 2 MG/ML IJ SOLN
1.0000 mg | Freq: Once | INTRAMUSCULAR | Status: AC
  Administered 2014-07-31: 1 mg via INTRAVENOUS
  Filled 2014-07-31: qty 1

## 2014-07-31 MED ORDER — ONDANSETRON HCL 4 MG PO TABS: 4.0000 mg | ORAL_TABLET | Freq: Three times a day (TID) | ORAL | Status: DC | PRN

## 2014-07-31 MED ORDER — ONDANSETRON HCL 4 MG/2ML IJ SOLN
4.0000 mg | Freq: Once | INTRAMUSCULAR | Status: AC
  Administered 2014-07-31: 4 mg via INTRAVENOUS
  Filled 2014-07-31: qty 2

## 2014-07-31 MED ORDER — SODIUM CHLORIDE 0.9 % IV SOLN: 1000.0000 mL | INTRAVENOUS | Status: DC

## 2014-07-31 NOTE — ED Notes (Signed)
Emesis x 1 while in pts room. Pt still rates pain a 7

## 2014-07-31 NOTE — Discharge Instructions (Signed)
Drink plenty of fluids. Avoid fried, spicy or greasy foods for the next couple of days. Use the zofran as needed for nausea or vomiting. You have pain pills you can take at home.  Recheck if you get worse.

## 2015-01-29 IMAGING — CR DG CHEST 2V
2 series · 2 of 2 positions shown · non-contrast
Comparison: None.

CLINICAL DATA: Right-sided back pain, kidney pain.

EXAM:
CHEST  2 VIEW

[view not recorded (1 of 2)]
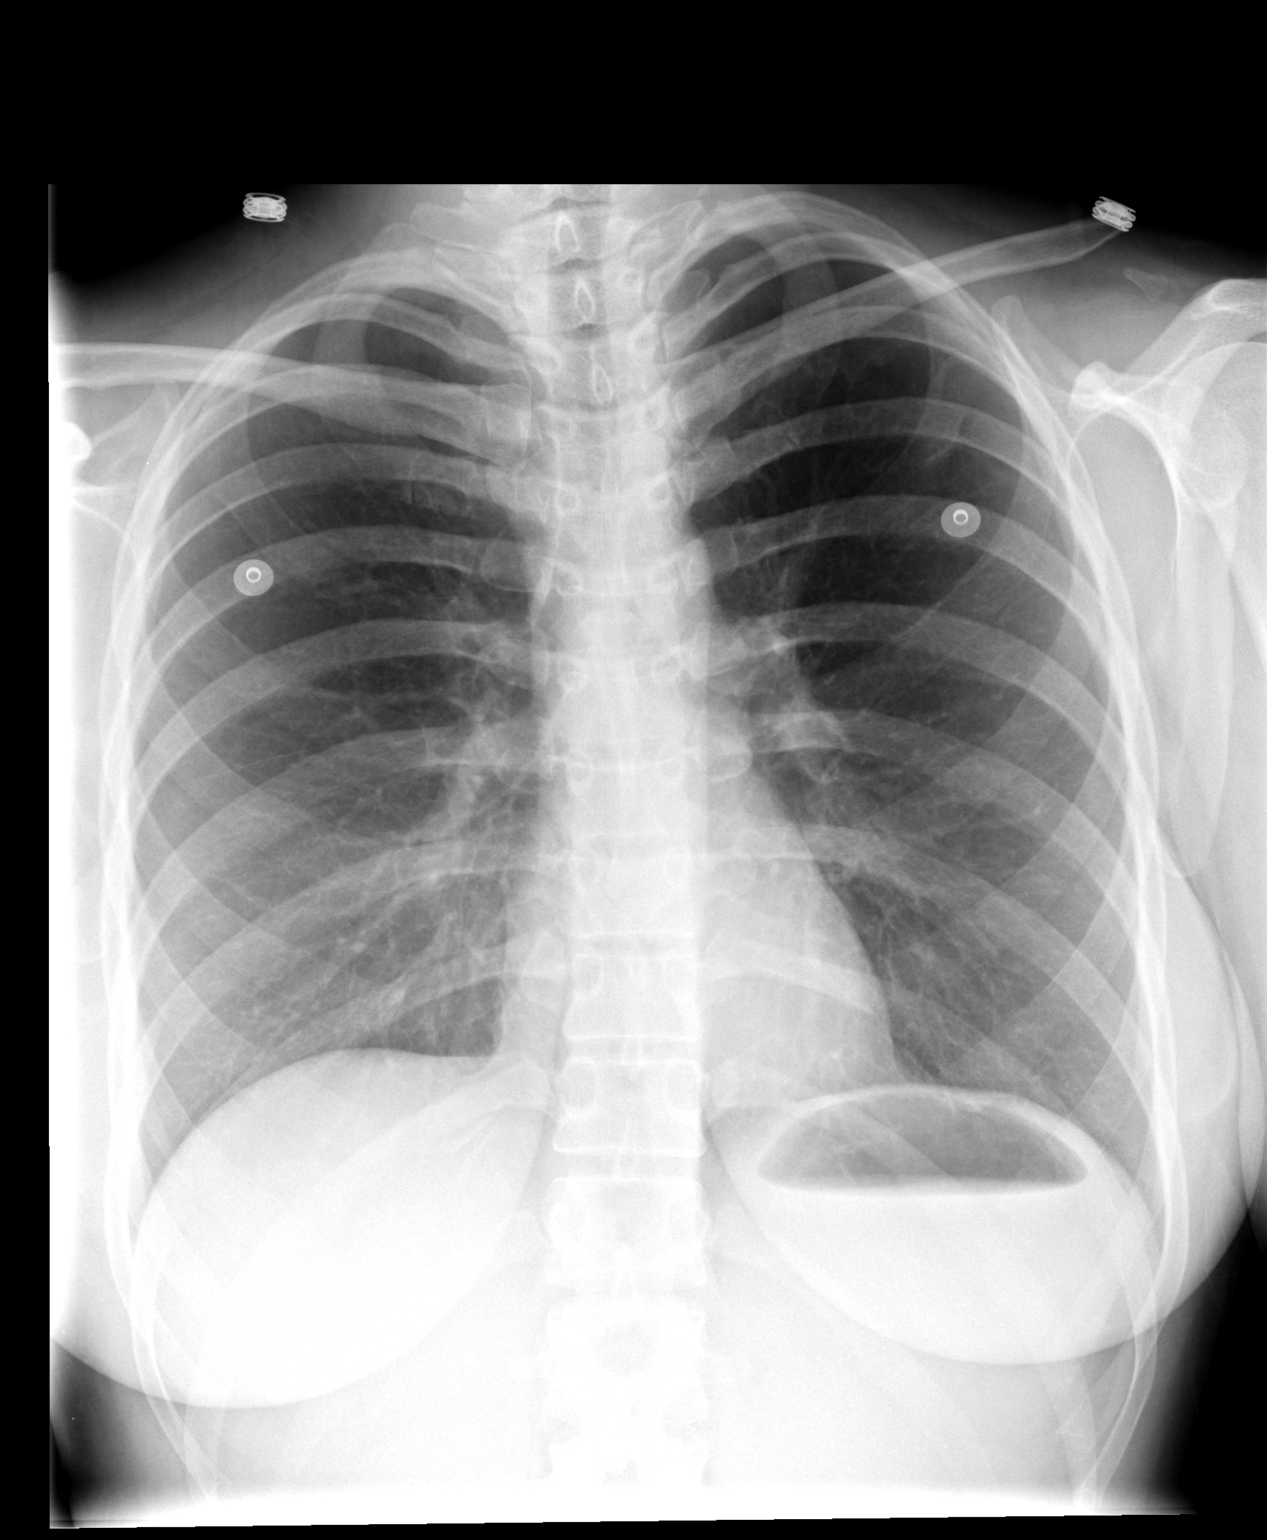

[view not recorded (2 of 2)]
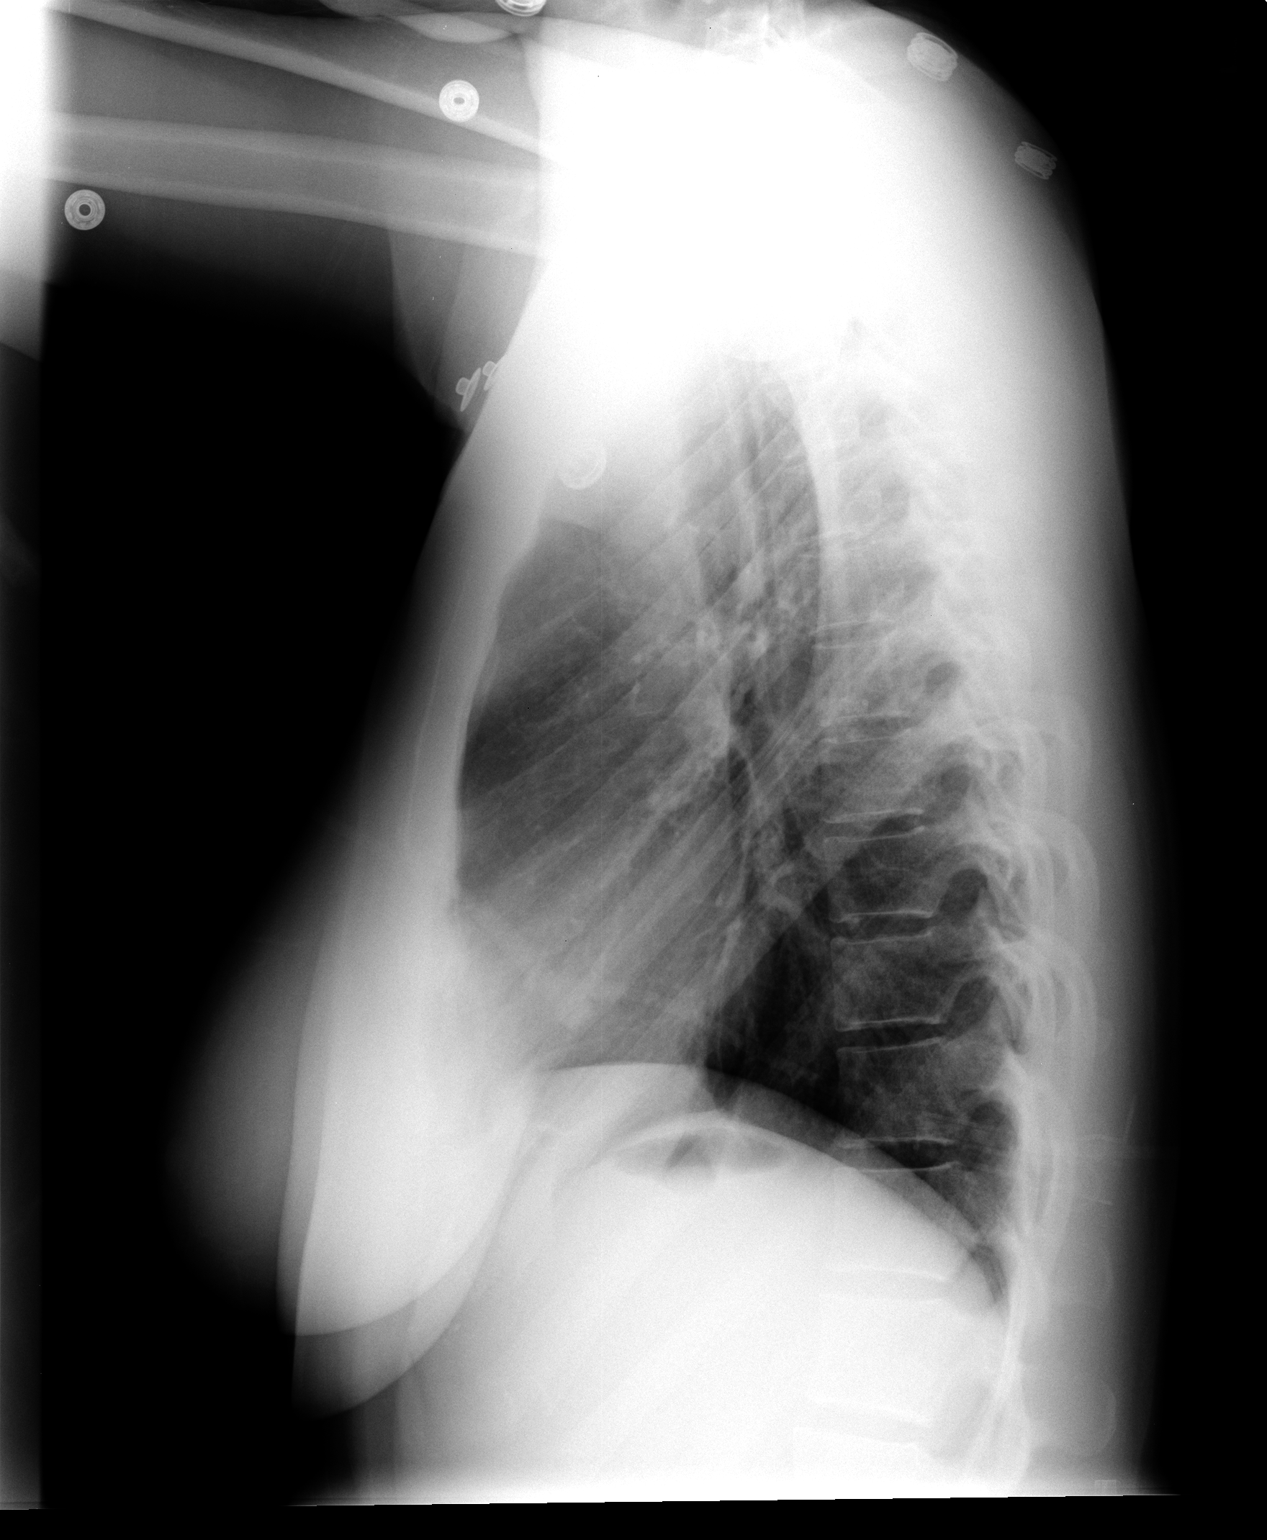

[2 of 2 positions shown; findings below may reference images not displayed]

FINDINGS: The heart size and mediastinal contours are within normal limits.
Both lungs are clear. The visualized skeletal structures are
unremarkable.
IMPRESSION: No active cardiopulmonary disease.

## 2015-01-29 IMAGING — CT CT ABD-PELV W/ CM
2 of 4 series · 15 of 46 positions shown, 17 images · IV contrast (Omnipaque 300)
Comparison: CT scan of the abdomen and pelvis dated February 29, 2012.

CLINICAL DATA: Elevated white blood cell count, right flank pain
and nausea and vomiting

EXAM:
CT ABDOMEN AND PELVIS WITH CONTRAST
TECHNIQUE: Multidetector CT imaging of the abdomen and pelvis was performed
using the standard protocol following bolus administration of
intravenous contrast.
CONTRAST:  43 mL OMNIPAQUE IOHEXOL 300 MG/ML SOLN, the patient also
received oral contrast.

[Series 2: abd_pel_with 5.0 b40f · axial · 0.63mm/px · z∈[-467,-72]mm · 12 of 91 slices shown, 14 images]
[im 8/91  soft-tissue]
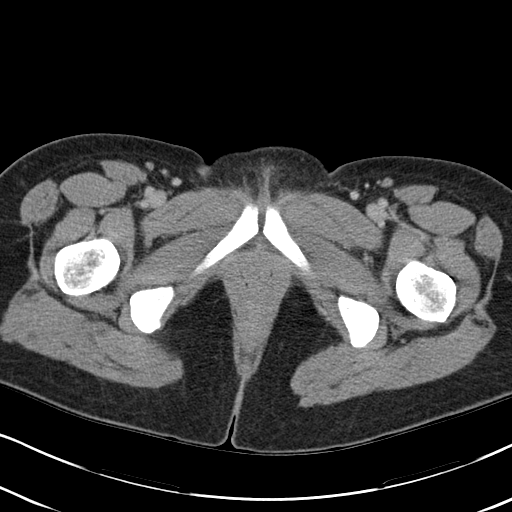
[im 8/91  bone]
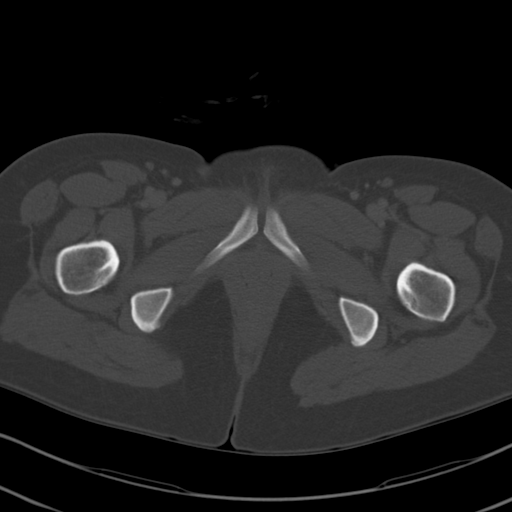
[im 15/91  soft-tissue]
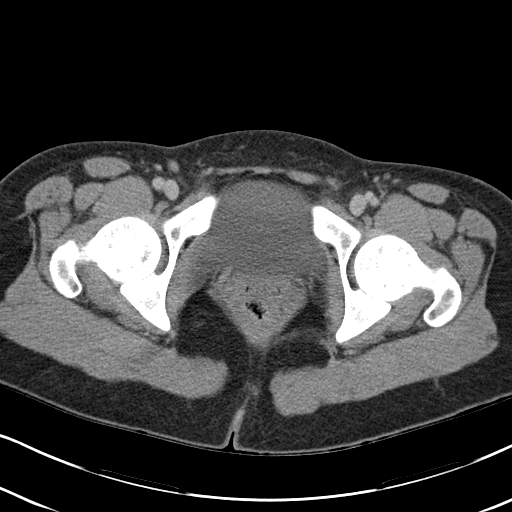
[im 22/91  soft-tissue]
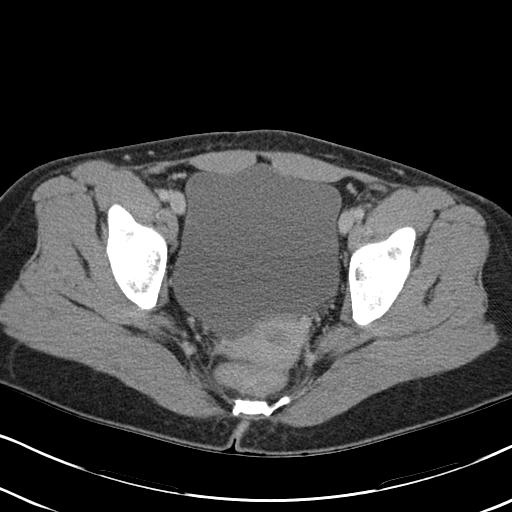
[im 29/91  soft-tissue]
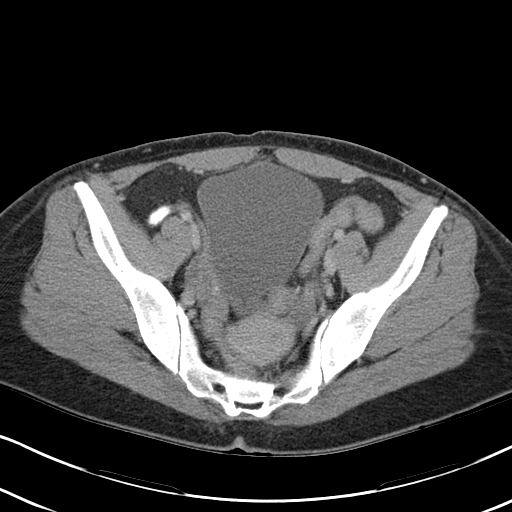
[im 37/91  soft-tissue]
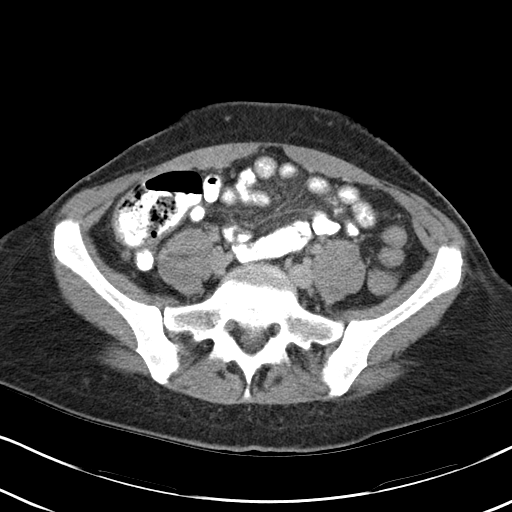
[im 44/91  soft-tissue]
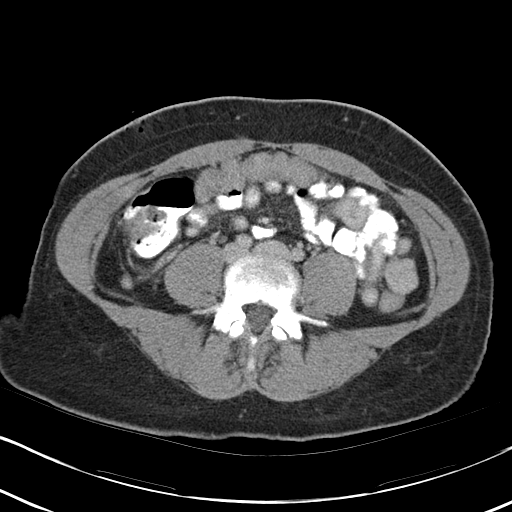
[im 51/91  soft-tissue]
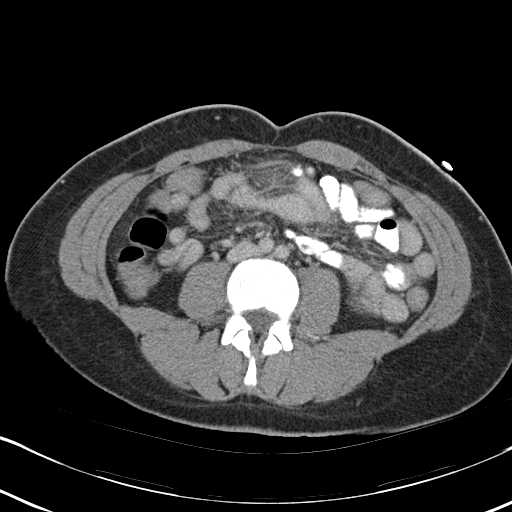
[im 58/91  soft-tissue]
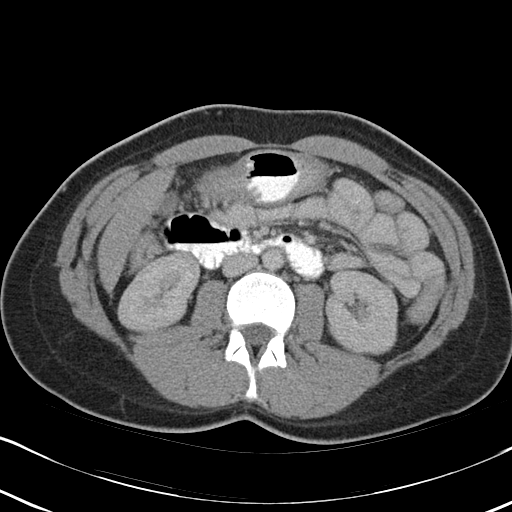
[im 65/91  soft-tissue]
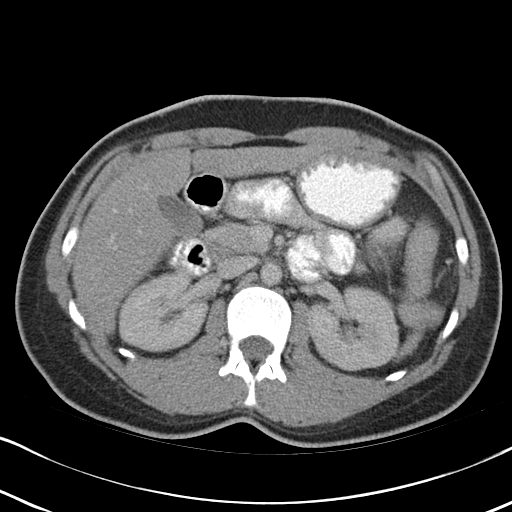
[im 65/91  bone]
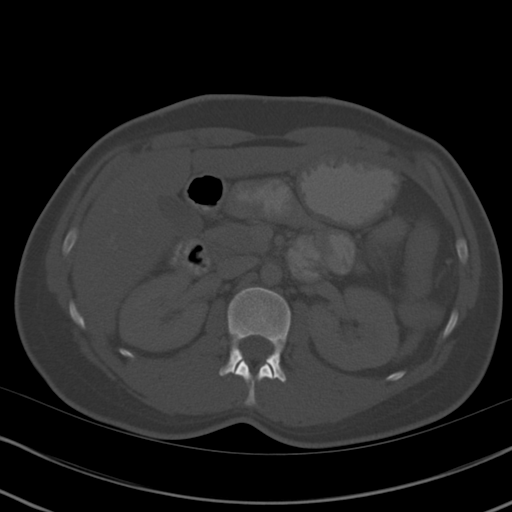
[im 73/91  soft-tissue]
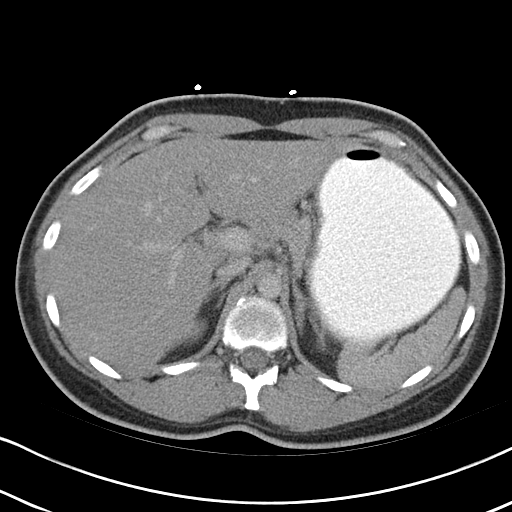
[im 80/91  soft-tissue]
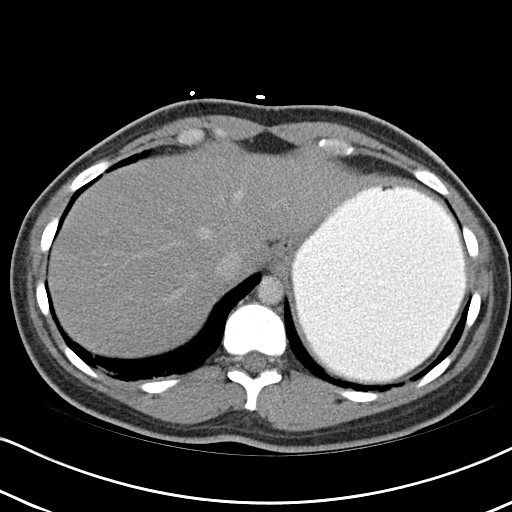
[im 87/91  soft-tissue]
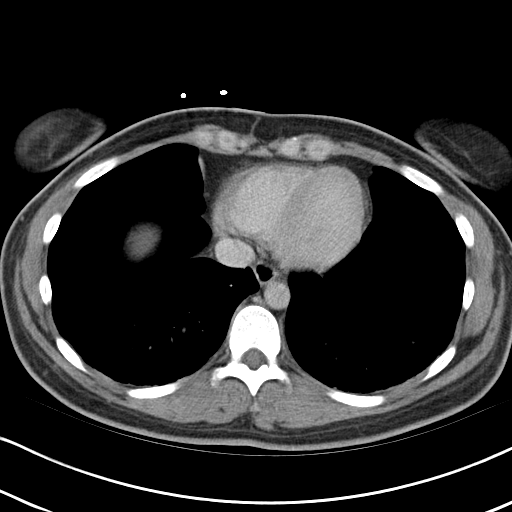

[Series 4: abd_pel_with 3.0 spo cor · coronal · 0.64mm/px · 3 of 72 slices shown]
[im 24/72  soft-tissue]
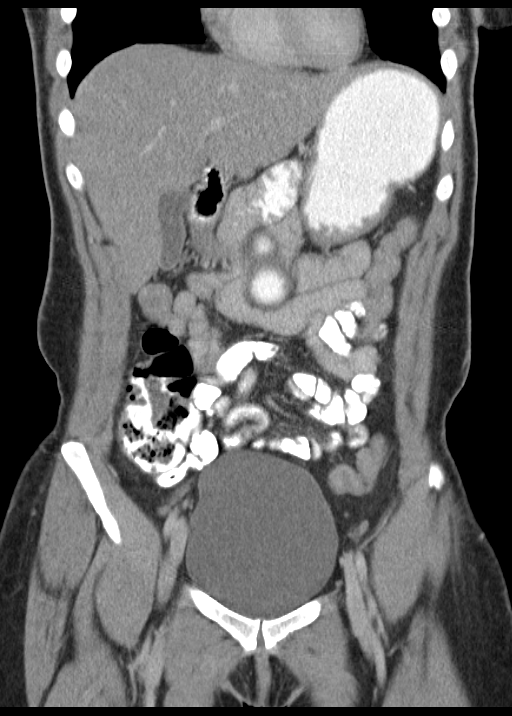
[im 32/72  soft-tissue]
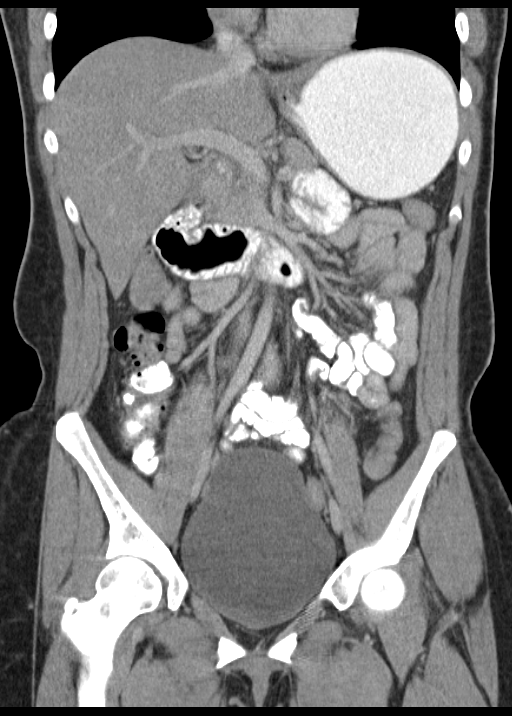
[im 40/72  soft-tissue]
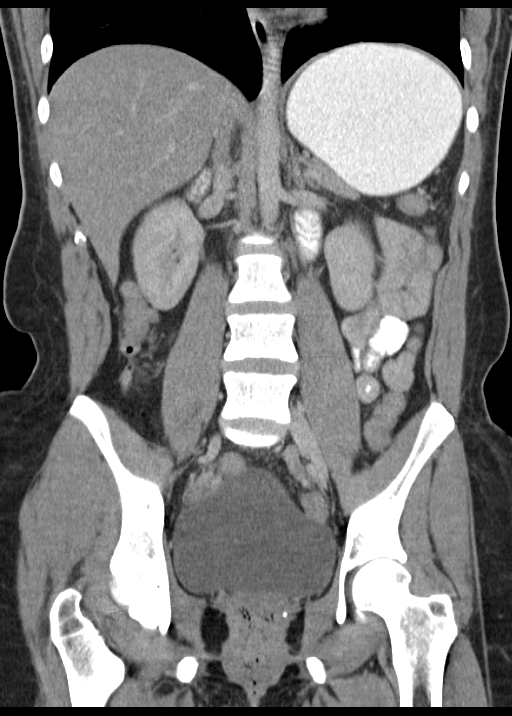

[15 of 46 positions shown; findings below may reference images not displayed]

FINDINGS: The liver exhibits no focal mass nor ductal dilation. Mildly
decreased density within the liver may reflect fatty infiltration.
The gallbladder is adequately distended with no evidence of stones
or acute inflammation. The spleen, moderately distended stomach,
duodenum, adrenal glands, and pancreas exhibit no acute
abnormalities. The kidneys are normal in contour. There is no
hydronephrosis. The appearance of the renal parenchyma does not
suggest pyelonephritis. No calcified stones are evident. The psoas
musculature appears normal in density and contour. The caliber of
the abdominal aorta is normal.

The partially contrast filled loops of small and large bowel exhibit
no evidence of ileus nor obstruction. The appendix is demonstrated
on images 44 through 56. There is a small amount of contrast within
the appendix. There is no discrete appendicolith. The maximal
measured diameter of the appendix is 10 mm. There is minimal
prominence of the periappendiceal fat near the base of the appendix
adjacent to the tip of the cecum on images 44-49. There is no
discrete abscess. There is no evidence of a small or large bowel
obstruction. There is no evidence of enteritis or colitis or
diverticulitis. There is no significant diverticulosis.

The urinary bladder is moderately distended. There are phleboliths
within the pelvis. The uterus is retroverted. There is fluid in the
endometrial cavity. There is no suspicious adnexal mass. There may
be small cysts associated with the left ovary. There is no inguinal
nor significant umbilical hernia.

The lung bases exhibit no infiltrates. There is minimal pleural
thickening posteriorly, bilaterally. The lumbar vertebral bodies are
preserved in height. The bony pelvis appears normal.
IMPRESSION: 1. There is minimal prominence of the periappendiceal fat without
definite inflammatory change of the appendix itself. This is a
subtle finding and may not be clinically significant but correlation
with patient's clinical exam is needed. There is no evidence of a
small or large bowel obstruction or acute inflammation.
2. There is no acute hepatobiliary abnormality. There is no acute
urinary tract abnormality. Specifically there are no findings to
suggest calcified urinary tract stones nor hydronephrosis. There is
no CT evidence of pyelonephritis.
3. Irregular density of the left ovary likely reflects multiple
subcentimeter cysts.
4. There is no evidence of an intra-abdominal or pelvic abscess nor
lymphadenopathy.

## 2015-05-04 ENCOUNTER — Ambulatory Visit: Payer: Self-pay | Admitting: Physician Assistant

## 2015-05-04 ENCOUNTER — Encounter: Payer: Self-pay | Admitting: Physician Assistant

## 2015-05-04 VITALS — BP 112/74 | HR 84 | Temp 97.5°F | Ht 64.75 in | Wt 169.8 lb

## 2015-05-04 DIAGNOSIS — F17210 Nicotine dependence, cigarettes, uncomplicated: Secondary | ICD-10-CM

## 2015-05-04 DIAGNOSIS — E785 Hyperlipidemia, unspecified: Secondary | ICD-10-CM

## 2015-05-04 DIAGNOSIS — E108 Type 1 diabetes mellitus with unspecified complications: Secondary | ICD-10-CM

## 2015-05-04 NOTE — Progress Notes (Signed)
BP 112/74 mmHg  Pulse 84  Temp(Src) 97.5 F (36.4 C)  Ht 5' 4.75" (1.645 m)  Wt 169 lb 12 oz (76.998 kg)  BMI 28.45 kg/m2  SpO2 96%   Subjective:    Patient ID: [name], female    DOB: [birth year], 39 y.o.   MRN: [id number]  HPI: Elizabeth GainerLori S Brodhead is a 39 y.o. female presenting on 05/04/2015 for Diabetes   HPI  Chief Complaint  Patient presents with  . Diabetes    pt states she needs more insulin    -Pt says she hasn't been here in a while b/c she says she had medicaid for a while (she went to Dr Aundria [facility]) but her daughter turned [id number] in August so she lost the medicaid.  -Pt says she has hearing for disability on Dec 1 (for dm and "mental issues")  -Pt says she didn't get labs drawn when with dr Sudie BaileyKnowlton  Relevant past medical, surgical, family and social history reviewed and updated as indicated. Interim medical history since our last visit reviewed. Allergies and medications reviewed and updated.   Current outpatient prescriptions:  .  Acetaminophen (TYLENOL PO), Take by mouth as needed., Disp: , Rfl:  .  amphetamine-dextroamphetamine (ADDERALL) 20 MG tablet, Take 20 mg by mouth 2 (two) times daily., Disp: , Rfl:  .  clindamycin (CLEOCIN) 150 MG capsule, Take 150 mg by mouth 3 (three) times daily. For seven days, Disp: , Rfl:  .  insulin aspart (NOVOLOG) 100 UNIT/ML injection, Inject into the skin 3 (three) times daily before meals. Pump, Disp: , Rfl:  .  Insulin Human (INSULIN PUMP) SOLN, Inject 1.2 each into the skin every 2 (two) hours., Disp: , Rfl:  .  Oxycodone HCl 10 MG TABS, Take 10 mg by mouth 4 (four) times daily., Disp: , Rfl:    Review of Systems  Constitutional: Positive for chills and fatigue. Negative for fever, diaphoresis, appetite change and unexpected weight change.  HENT: Positive for congestion, dental problem, sneezing and sore throat. Negative for drooling, ear pain, facial swelling, hearing loss, mouth sores, trouble swallowing and voice change.    Eyes: Positive for itching and visual disturbance. Negative for pain, discharge and redness.  Respiratory: Positive for shortness of breath. Negative for cough, choking and wheezing.   Cardiovascular: Negative for chest pain, palpitations and leg swelling.  Gastrointestinal: Negative for vomiting, abdominal pain, diarrhea, constipation and blood in stool.  Endocrine: Positive for cold intolerance. Negative for heat intolerance and polydipsia.  Genitourinary: Negative for dysuria, hematuria and decreased urine volume.  Musculoskeletal: Positive for back pain and arthralgias. Negative for gait problem.  Skin: Negative for rash.  Allergic/Immunologic: Negative for environmental allergies.  Neurological: Positive for light-headedness and headaches. Negative for seizures and syncope.  Hematological: Negative for adenopathy.  Psychiatric/Behavioral: Positive for dysphoric mood. Negative for suicidal ideas and agitation. The patient is nervous/anxious.     Per HPI unless specifically indicated above     Objective:    BP 112/74 mmHg  Pulse 84  Temp(Src) 97.5 F (36.4 C)  Ht 5' 4.75" (1.645 m)  Wt 169 lb 12 oz (76.998 kg)  BMI 28.45 kg/m2  SpO2 96%  Wt Readings from Last 3 Encounters:  05/04/15 169 lb 12 oz (76.998 kg)  07/30/14 160 lb (72.576 kg)  04/23/14 160 lb (72.576 kg)    Physical Exam  Constitutional: She is oriented to person, place, and time. She appears well-developed and well-nourished.  HENT:  Head: Normocephalic and atraumatic.  Neck: Neck supple.  Cardiovascular: Normal rate and regular rhythm.   Pulmonary/Chest: Effort normal and breath sounds normal.  Abdominal: Soft. Bowel sounds are normal. She exhibits no mass. There is no tenderness.  Musculoskeletal: She exhibits no edema.  Lymphadenopathy:    She has no cervical adenopathy.  Neurological: She is alert and oriented to person, place, and time.  Skin: Skin is warm and dry.  Psychiatric: She has a normal mood  and affect. Her behavior is normal.  Vitals reviewed.       Assessment & Plan:   Encounter Diagnoses  Name Primary?  . Type 1 diabetes mellitus with complication (HCC) Yes  . Cigarette nicotine dependence, uncomplicated   . Hyperlipemia     -When here last (march) was on lantus and novolog.  Will get her some ordered.   -Get labs drawn -Get eye dr report (pt states exam about 2 mo ago) -counseled on smoking cessation -F/u OV 1 mo

## 2015-05-15 DIAGNOSIS — F17210 Nicotine dependence, cigarettes, uncomplicated: Secondary | ICD-10-CM | POA: Insufficient documentation

## 2015-05-15 DIAGNOSIS — E108 Type 1 diabetes mellitus with unspecified complications: Secondary | ICD-10-CM | POA: Insufficient documentation

## 2015-05-15 DIAGNOSIS — E785 Hyperlipidemia, unspecified: Secondary | ICD-10-CM | POA: Insufficient documentation

## 2015-05-30 ENCOUNTER — Ambulatory Visit: Payer: Self-pay | Admitting: Physician Assistant

## 2015-06-06 ENCOUNTER — Ambulatory Visit: Payer: Self-pay | Admitting: Physician Assistant

## 2015-06-16 ENCOUNTER — Ambulatory Visit: Payer: Self-pay | Admitting: Physician Assistant

## 2015-07-19 ENCOUNTER — Ambulatory Visit: Payer: Self-pay | Admitting: Physician Assistant

## 2015-07-19 ENCOUNTER — Encounter: Payer: Self-pay | Admitting: Physician Assistant

## 2015-07-19 VITALS — BP 94/60 | HR 108 | Temp 97.9°F | Ht 64.75 in | Wt 167.5 lb

## 2015-07-19 DIAGNOSIS — Z9119 Patient's noncompliance with other medical treatment and regimen: Secondary | ICD-10-CM

## 2015-07-19 DIAGNOSIS — Z91199 Patient's noncompliance with other medical treatment and regimen due to unspecified reason: Secondary | ICD-10-CM

## 2015-07-19 DIAGNOSIS — E1065 Type 1 diabetes mellitus with hyperglycemia: Secondary | ICD-10-CM

## 2015-07-19 DIAGNOSIS — IMO0002 Reserved for concepts with insufficient information to code with codable children: Secondary | ICD-10-CM

## 2015-07-19 DIAGNOSIS — E108 Type 1 diabetes mellitus with unspecified complications: Secondary | ICD-10-CM

## 2015-07-19 NOTE — Patient Instructions (Signed)
Get fasting labs drawn Attend DM education class  Smoking Cessation, Tips for Success If you are ready to quit smoking, congratulations! You have chosen to help yourself be healthier. Cigarettes bring nicotine, tar, carbon monoxide, and other irritants into your body. Your lungs, heart, and blood vessels will be able to work better without these poisons. There are many different ways to quit smoking. Nicotine gum, nicotine patches, a nicotine inhaler, or nicotine nasal spray can help with physical craving. Hypnosis, support groups, and medicines help break the habit of smoking. WHAT THINGS CAN I DO TO MAKE QUITTING EASIER?  Here are some tips to help you quit for good:  Pick a date when you will quit smoking completely. Tell all of your friends and family about your plan to quit on that date.  Do not try to slowly cut down on the number of cigarettes you are smoking. Pick a quit date and quit smoking completely starting on that day.  Throw away all cigarettes.   Clean and remove all ashtrays from your home, work, and car.  On a card, write down your reasons for quitting. Carry the card with you and read it when you get the urge to smoke.  Cleanse your body of nicotine. Drink enough water and fluids to keep your urine clear or pale yellow. Do this after quitting to flush the nicotine from your body.  Learn to predict your moods. Do not let a bad situation be your excuse to have a cigarette. Some situations in your life might tempt you into wanting a cigarette.  Never have "just one" cigarette. It leads to wanting another and another. Remind yourself of your decision to quit.  Change habits associated with smoking. If you smoked while driving or when feeling stressed, try other activities to replace smoking. Stand up when drinking your coffee. Brush your teeth after eating. Sit in a different chair when you read the paper. Avoid alcohol while trying to quit, and try to drink fewer caffeinated  beverages. Alcohol and caffeine may urge you to smoke.  Avoid foods and drinks that can trigger a desire to smoke, such as sugary or spicy foods and alcohol.  Ask people who smoke not to smoke around you.  Have something planned to do right after eating or having a cup of coffee. For example, plan to take a walk or exercise.  Try a relaxation exercise to calm you down and decrease your stress. Remember, you may be tense and nervous for the first 2 weeks after you quit, but this will pass.  Find new activities to keep your hands busy. Play with a pen, coin, or rubber band. Doodle or draw things on paper.  Brush your teeth right after eating. This will help cut down on the craving for the taste of tobacco after meals. You can also try mouthwash.   Use oral substitutes in place of cigarettes. Try using lemon drops, carrots, cinnamon sticks, or chewing gum. Keep them handy so they are available when you have the urge to smoke.  When you have the urge to smoke, try deep breathing.  Designate your home as a nonsmoking area.  If you are a heavy smoker, ask your health care provider about a prescription for nicotine chewing gum. It can ease your withdrawal from nicotine.  Reward yourself. Set aside the cigarette money you save and buy yourself something nice.  Look for support from others. Join a support group or smoking cessation program. Ask someone at home  or at work to help you with your plan to quit smoking.  Always ask yourself, "Do I need this cigarette or is this just a reflex?" Tell yourself, "Today, I choose not to smoke," or "I do not want to smoke." You are reminding yourself of your decision to quit.  Do not replace cigarette smoking with electronic cigarettes (commonly called e-cigarettes). The safety of e-cigarettes is unknown, and some may contain harmful chemicals.  If you relapse, do not give up! Plan ahead and think about what you will do the next time you get the urge to  smoke. HOW WILL I FEEL WHEN I QUIT SMOKING? You may have symptoms of withdrawal because your body is used to nicotine (the addictive substance in cigarettes). You may crave cigarettes, be irritable, feel very hungry, cough often, get headaches, or have difficulty concentrating. The withdrawal symptoms are only temporary. They are strongest when you first quit but will go away within 10-14 days. When withdrawal symptoms occur, stay in control. Think about your reasons for quitting. Remind yourself that these are signs that your body is healing and getting used to being without cigarettes. Remember that withdrawal symptoms are easier to treat than the major diseases that smoking can cause.  Even after the withdrawal is over, expect periodic urges to smoke. However, these cravings are generally short lived and will go away whether you smoke or not. Do not smoke! WHAT RESOURCES ARE AVAILABLE TO HELP ME QUIT SMOKING? Your health care provider can direct you to community resources or hospitals for support, which may include:  Group support.  Education.  Hypnosis.  Therapy.   This information is not intended to replace advice given to you by your health care provider. Make sure you discuss any questions you have with your health care provider.   Document Released: 03/02/2004 Document Revised: 06/25/2014 Document Reviewed: 11/20/2012 Elsevier Interactive Patient Education [employer].

## 2015-07-19 NOTE — Progress Notes (Signed)
BP 94/60 mmHg  Pulse 108  Temp(Src) 97.9 F (36.6 C)  Ht 5' 4.75" (1.645 m)  Wt 167 lb 8 oz (75.978 kg)  BMI 28.08 kg/m2  SpO2 94%   Subjective:    Patient ID: [name], female    DOB: [birth year], 40 y.o.   MRN: [id number]  HPI: [name] is a 40 y.o. female presenting on 07/19/2015 for Diabetes   HPI   Discussed noncompliance- missed appointments, rescheduled appointments, didn't get labs drawn, etc.  This has been an ongoing problem the entire time she has been coming to [facility].  She expects to skip appointments and continue to pick up insulin for free.  Discussed that ongoing monitoring is important to manage her diabetes.  Pt states her bs 150 this a.m.  Had 2 EMS visits last week due to it being low.  States it has also been "high"  States URI symptoms x 2 wk and it is getting better.   Pt states she is also going to dr Sudie Bailey who she sees every 6 months for her pain and psych meds.  Pt c/o bump L armpit that comes and goes for years.  Says it it only mildly sore and has never drained.  Relevant past medical, surgical, family and social history reviewed and updated as indicated. Interim medical history since our last visit reviewed. Allergies and medications reviewed and updated.  Current outpatient prescriptions:  .  ALPRAZolam (XANAX) 1 MG tablet, Take 1 mg by mouth 4 (four) times daily., Disp: , Rfl:  .  amphetamine-dextroamphetamine (ADDERALL) 20 MG tablet, Take 20 mg by mouth 2 (two) times daily., Disp: , Rfl:  .  insulin aspart (NOVOLOG) 100 UNIT/ML injection, Inject into the skin 3 (three) times daily before meals. Pump, Disp: , Rfl:  .  insulin glargine (LANTUS) 100 UNIT/ML injection, Inject 20 Units into the skin 2 (two) times daily., Disp: , Rfl:  .  Naproxen Sodium (ALEVE PO), Take by mouth daily as needed., Disp: , Rfl:  .  Oxycodone HCl 10 MG TABS, Take 10 mg by mouth 5 (five) times daily. , Disp: , Rfl:  .  traZODone (DESYREL) 100 MG tablet,  Take 100-200 mg by mouth at bedtime., Disp: , Rfl:    Review of Systems  Constitutional: Positive for fever, chills, diaphoresis and fatigue. Negative for appetite change and unexpected weight change.  HENT: Positive for congestion, dental problem, ear pain, hearing loss and sore throat. Negative for drooling, facial swelling, mouth sores, sneezing, trouble swallowing and voice change.   Eyes: Positive for visual disturbance. Negative for pain, discharge, redness and itching.  Respiratory: Positive for wheezing. Negative for cough, choking and shortness of breath.   Cardiovascular: Positive for chest pain. Negative for palpitations and leg swelling.  Gastrointestinal: Positive for abdominal pain. Negative for vomiting, diarrhea, constipation and blood in stool.  Endocrine: Positive for cold intolerance and polydipsia. Negative for heat intolerance.  Genitourinary: Positive for decreased urine volume. Negative for dysuria and hematuria.  Musculoskeletal: Positive for back pain, arthralgias and gait problem.  Skin: Negative for rash.  Allergic/Immunologic: Positive for environmental allergies.  Neurological: Positive for syncope, light-headedness and headaches. Negative for seizures.  Hematological: Positive for adenopathy.  Psychiatric/Behavioral: Positive for suicidal ideas, dysphoric mood and agitation. The patient is nervous/anxious.     Per HPI unless specifically indicated above     Objective:    BP 94/60 mmHg  Pulse 108  Temp(Src) 97.9 F (36.6 C)  Ht  5' 4.75" (1.645 m)  Wt 167 lb 8 oz (75.978 kg)  BMI 28.08 kg/m2  SpO2 94%  Wt Readings from Last 3 Encounters:  07/19/15 167 lb 8 oz (75.978 kg)  05/04/15 169 lb 12 oz (76.998 kg)  07/30/14 160 lb (72.576 kg)    Physical Exam  Constitutional: She is oriented to person, place, and time. She appears well-developed and well-nourished.  HENT:  Head: Normocephalic and atraumatic.  Neck: Neck supple.  Cardiovascular: Normal  rate and regular rhythm.   Pulmonary/Chest: Effort normal and breath sounds normal.  Abdominal: Soft. Bowel sounds are normal. She exhibits no mass. There is no hepatosplenomegaly. There is no tenderness.  Musculoskeletal: She exhibits no edema.  Lymphadenopathy:    She has no cervical adenopathy.    She has axillary adenopathy.  Small solitary L armpit nodule, soft and mobile and nontender  Neurological: She is alert and oriented to person, place, and time.  Skin: Skin is warm and dry.  Psychiatric: She has a normal mood and affect. Her behavior is normal.  Vitals reviewed.         Assessment & Plan:   Encounter Diagnoses  Name Primary?  . Personal history of noncompliance with medical treatment, presenting hazards to health Yes  . Uncontrolled type 1 diabetes mellitus with complication (HCC)     -pt needs to Get labs drawn -she Still needs to attend dm educ class -discussed non-compliance -recommend pt use warm compress to L axilla when bump appears. Also abstain from shaving and deodorant. -counseled on smoking cessation (spent 1/2 hour with pt) -f/u 3 wk to review labs and discuss diabetes management

## 2015-07-20 DIAGNOSIS — Z91199 Patient's noncompliance with other medical treatment and regimen due to unspecified reason: Secondary | ICD-10-CM | POA: Insufficient documentation

## 2015-07-20 DIAGNOSIS — Z9119 Patient's noncompliance with other medical treatment and regimen: Secondary | ICD-10-CM | POA: Insufficient documentation

## 2015-08-05 ENCOUNTER — Other Ambulatory Visit: Payer: Self-pay | Admitting: Physician Assistant

## 2015-08-05 LAB — LIPID PANEL
CHOL/HDL RATIO: 4.7 ratio (ref <=5.0)
CHOLESTEROL: 212 mg/dL — AB (ref 125–200)
HDL: 45 mg/dL — ABNORMAL LOW (ref >=46)
LDL Cholesterol: 153 mg/dL — ABNORMAL HIGH (ref <130)
Triglycerides: 70 mg/dL (ref <150)
VLDL: 14 mg/dL (ref <30)

## 2015-08-05 LAB — HEMOGLOBIN A1C
HEMOGLOBIN A1C: 7.1 % — AB (ref <5.7)
Mean Plasma Glucose: 157 mg/dL — ABNORMAL HIGH (ref <117)

## 2015-08-08 ENCOUNTER — Ambulatory Visit: Payer: Self-pay | Admitting: Physician Assistant

## 2015-08-09 ENCOUNTER — Ambulatory Visit: Payer: Self-pay | Admitting: Physician Assistant

## 2015-08-09 ENCOUNTER — Encounter: Payer: Self-pay | Admitting: Physician Assistant

## 2015-08-09 VITALS — BP 120/74 | HR 88 | Temp 98.1°F | Ht 64.75 in | Wt 160.8 lb

## 2015-08-09 DIAGNOSIS — E108 Type 1 diabetes mellitus with unspecified complications: Secondary | ICD-10-CM

## 2015-08-09 DIAGNOSIS — E785 Hyperlipidemia, unspecified: Secondary | ICD-10-CM

## 2015-08-09 DIAGNOSIS — F32A Depression, unspecified: Secondary | ICD-10-CM

## 2015-08-09 DIAGNOSIS — F329 Major depressive disorder, single episode, unspecified: Secondary | ICD-10-CM | POA: Insufficient documentation

## 2015-08-09 DIAGNOSIS — F17210 Nicotine dependence, cigarettes, uncomplicated: Secondary | ICD-10-CM

## 2015-08-09 LAB — COMPREHENSIVE METABOLIC PANEL
ALBUMIN: 4.4 g/dL (ref 3.6–5.1)
ALT: 9 U/L (ref 6–29)
AST: 15 U/L (ref 10–30)
Alkaline Phosphatase: 101 U/L (ref 33–115)
BUN: 13 mg/dL (ref 7–25)
CALCIUM: 10 mg/dL (ref 8.6–10.2)
CHLORIDE: 100 mmol/L (ref 98–110)
CO2: 30 mmol/L (ref 20–31)
Creat: 1.01 mg/dL (ref 0.50–1.10)
Glucose, Bld: 31 mg/dL — CL (ref 65–99)
POTASSIUM: 3.8 mmol/L (ref 3.5–5.3)
SODIUM: 139 mmol/L (ref 135–146)
Total Bilirubin: 0.4 mg/dL (ref 0.2–1.2)
Total Protein: 7.6 g/dL (ref 6.1–8.1)

## 2015-08-09 MED ORDER — ATORVASTATIN CALCIUM 20 MG PO TABS: 20.0000 mg | ORAL_TABLET | Freq: Every day | ORAL | Status: AC

## 2015-08-09 NOTE — Progress Notes (Signed)
BP 120/74 mmHg  Pulse 88  Temp(Src) 98.1 F (36.7 C)  Ht 5' 4.75" (1.645 m)  Wt 160 lb 12.8 oz (72.938 kg)  BMI 26.95 kg/m2  SpO2 96%   Subjective:    Patient ID: [name], female    DOB: 1975-07-25, 40 y.o.   MRN: [id number]  HPI: [name] is a 40 y.o. female presenting on 08/09/2015 for Diabetes   HPI   Pt's mother is with her today  Pt states paramedics came to her home 6 times in the past month.  Pt states she takes novolog up to 10 times daily some days.  She takes it even if she is not eating, just "if it's high, over 150".    Pt states she did okay on she thinks lipitor in the past.  Pt states she feels tired all the time.  Discussed with pt that depression may be contributing to this, to which she agrees,  but she says she doesn't want to do counseling b/c she doesn't like sharing about herself.  She gets her psych meds/controlled substances from dr Sudie Bailey.  Discussed endorinology referral. Pt says she saw dr Evlyn Kanner in the past (endocrinologist in [place]).  Relevant past medical, surgical, family and social history reviewed and updated as indicated. Interim medical history since our last visit reviewed. Allergies and medications reviewed and updated.  Current outpatient prescriptions:  .  ALPRAZolam (XANAX) 1 MG tablet, Take 1 mg by mouth 4 (four) times daily., Disp: , Rfl:  .  amphetamine-dextroamphetamine (ADDERALL) 20 MG tablet, Take 20 mg by mouth 2 (two) times daily., Disp: , Rfl:  .  insulin aspart (NOVOLOG) 100 UNIT/ML injection, Inject into the skin 3 (three) times daily before meals. Pt states she is using up to 10 times a day depending on what her sugar is., Disp: , Rfl:  .  insulin glargine (LANTUS) 100 UNIT/ML injection, Inject 40 Units into the skin daily. , Disp: , Rfl:  .  Naproxen Sodium (ALEVE PO), Take by mouth daily as needed., Disp: , Rfl:  .  Oxycodone HCl 10 MG TABS, Take 10 mg by mouth 5 (five) times daily. , Disp: , Rfl:  .   traZODone (DESYREL) 100 MG tablet, Take 100-200 mg by mouth at bedtime. Reported on 08/09/2015, Disp: , Rfl:    Review of Systems  Constitutional: Positive for fatigue. Negative for fever, chills, diaphoresis, appetite change and unexpected weight change.  HENT: Positive for dental problem, sneezing and sore throat. Negative for congestion, drooling, ear pain, facial swelling, hearing loss, mouth sores, trouble swallowing and voice change.   Eyes: Negative for pain, discharge, redness, itching and visual disturbance.  Respiratory: Negative for cough, choking, shortness of breath and wheezing.   Cardiovascular: Negative for chest pain, palpitations and leg swelling.  Gastrointestinal: Negative for vomiting, abdominal pain, diarrhea, constipation and blood in stool.  Endocrine: Positive for polydipsia. Negative for cold intolerance and heat intolerance.  Genitourinary: Negative for dysuria, hematuria and decreased urine volume.  Musculoskeletal: Positive for back pain, arthralgias and gait problem.  Skin: Negative for rash.  Allergic/Immunologic: Negative for environmental allergies.  Neurological: Positive for syncope (when bs too low) and headaches. Negative for seizures and light-headedness.  Hematological: Negative for adenopathy.  Psychiatric/Behavioral: Positive for dysphoric mood. Negative for suicidal ideas and agitation. The patient is nervous/anxious.     Per HPI unless specifically indicated above     Objective:    BP 120/74 mmHg  Pulse 88  Temp(Src)  98.1 F (36.7 C)  Ht 5' 4.75" (1.645 m)  Wt 160 lb 12.8 oz (72.938 kg)  BMI 26.95 kg/m2  SpO2 96%  Wt Readings from Last 3 Encounters:  08/09/15 160 lb 12.8 oz (72.938 kg)  07/19/15 167 lb 8 oz (75.978 kg)  05/04/15 169 lb 12 oz (76.998 kg)    Physical Exam  Constitutional: She is oriented to person, place, and time. She appears well-developed and well-nourished.  HENT:  Head: Normocephalic and atraumatic.  Neck: Neck  supple.  Cardiovascular: Normal rate and regular rhythm.   Pulmonary/Chest: Effort normal and breath sounds normal.  Abdominal: Soft. Bowel sounds are normal. She exhibits no mass. There is no hepatosplenomegaly. There is no tenderness.  Musculoskeletal: She exhibits no edema.  Lymphadenopathy:    She has no cervical adenopathy.  Neurological: She is alert and oriented to person, place, and time.  Skin: Skin is warm and dry.  Multiple well-healed cutting scars.  Psychiatric: Her behavior is normal. Her mood appears anxious.  Pt guarded. Not overtly antagonistic, but on-edge, defensive.  Vitals reviewed. foot exam done today  Results for orders placed or performed in visit on 08/05/15  Lipid panel  Result Value Ref Range   Cholesterol 212 (H) 125 - 200 mg/dL   Triglycerides 70 <409 mg/dL   HDL 45 (L) >=81 mg/dL   Total CHOL/HDL Ratio 4.7 <=5.0 Ratio   VLDL 14 <30 mg/dL   LDL Cholesterol 191 (H) <130 mg/dL  Hemoglobin Y7W  Result Value Ref Range   Hgb A1c MFr Bld 7.1 (H) <5.7 %   Mean Plasma Glucose 157 (H) <117 mg/dL      Assessment & Plan:   Encounter Diagnoses  Name Primary?  . Type 1 diabetes mellitus with complication (HCC) Yes  . Hyperlipidemia   . Cigarette nicotine dependence, uncomplicated   . Depression     -reviewed labs with pt -Gave [employer] discount application- will refer to endocrine when approved. -Recommended to pt she avoid giving herself insulin 10 times daily including times that she is not eating.  explained to pt this is dangerous and could cause death but she is emphatic that she needs insulin if her sugar is elevated and is very argumentative that she will not stop doing this despite the risks -pt will be signed up for medassist and lipitor will be ordered for her to treat her cholesterol -told pt she MUST attend DM education class.  It is required of ALL diabetic patients at the free clinic.  She has been coming to [facility] since December 2015 which has  been plenty long enough to do this.  She is given handout with information on 2 classes taught at [facility] each month -her urine is sent today for microalbumin since she was unable to void at the lab -she is on list for diabetic eye exam -will f/u in one month to see how she is tolerating the lipitor and to check on the status of her cone discount

## 2015-08-17 ENCOUNTER — Ambulatory Visit: Payer: Self-pay | Attending: Internal Medicine

## 2015-09-05 ENCOUNTER — Encounter: Payer: Self-pay | Admitting: Family Medicine

## 2015-09-05 ENCOUNTER — Ambulatory Visit: Payer: Self-pay | Attending: Family Medicine | Admitting: Family Medicine

## 2015-09-05 VITALS — BP 99/63 | HR 93 | Temp 97.5°F | Resp 16 | Ht 62.0 in | Wt 162.0 lb

## 2015-09-05 DIAGNOSIS — F603 Borderline personality disorder: Secondary | ICD-10-CM | POA: Insufficient documentation

## 2015-09-05 DIAGNOSIS — Z79899 Other long term (current) drug therapy: Secondary | ICD-10-CM | POA: Insufficient documentation

## 2015-09-05 DIAGNOSIS — N319 Neuromuscular dysfunction of bladder, unspecified: Secondary | ICD-10-CM | POA: Insufficient documentation

## 2015-09-05 DIAGNOSIS — E1040 Type 1 diabetes mellitus with diabetic neuropathy, unspecified: Secondary | ICD-10-CM | POA: Insufficient documentation

## 2015-09-05 DIAGNOSIS — Z9889 Other specified postprocedural states: Secondary | ICD-10-CM | POA: Insufficient documentation

## 2015-09-05 DIAGNOSIS — E108 Type 1 diabetes mellitus with unspecified complications: Secondary | ICD-10-CM

## 2015-09-05 DIAGNOSIS — E1042 Type 1 diabetes mellitus with diabetic polyneuropathy: Secondary | ICD-10-CM

## 2015-09-05 DIAGNOSIS — M797 Fibromyalgia: Secondary | ICD-10-CM | POA: Insufficient documentation

## 2015-09-05 DIAGNOSIS — E1065 Type 1 diabetes mellitus with hyperglycemia: Secondary | ICD-10-CM | POA: Insufficient documentation

## 2015-09-05 DIAGNOSIS — F419 Anxiety disorder, unspecified: Secondary | ICD-10-CM | POA: Insufficient documentation

## 2015-09-05 DIAGNOSIS — Z794 Long term (current) use of insulin: Secondary | ICD-10-CM | POA: Insufficient documentation

## 2015-09-05 DIAGNOSIS — F17210 Nicotine dependence, cigarettes, uncomplicated: Secondary | ICD-10-CM | POA: Insufficient documentation

## 2015-09-05 DIAGNOSIS — R739 Hyperglycemia, unspecified: Secondary | ICD-10-CM

## 2015-09-05 DIAGNOSIS — G8929 Other chronic pain: Secondary | ICD-10-CM | POA: Insufficient documentation

## 2015-09-05 DIAGNOSIS — F319 Bipolar disorder, unspecified: Secondary | ICD-10-CM | POA: Insufficient documentation

## 2015-09-05 DIAGNOSIS — Z114 Encounter for screening for human immunodeficiency virus [HIV]: Secondary | ICD-10-CM | POA: Insufficient documentation

## 2015-09-05 DIAGNOSIS — E1165 Type 2 diabetes mellitus with hyperglycemia: Secondary | ICD-10-CM | POA: Insufficient documentation

## 2015-09-05 DIAGNOSIS — E1140 Type 2 diabetes mellitus with diabetic neuropathy, unspecified: Secondary | ICD-10-CM | POA: Insufficient documentation

## 2015-09-05 LAB — HIV ANTIBODY (ROUTINE TESTING W REFLEX): HIV 1&2 Ab, 4th Generation: NONREACTIVE

## 2015-09-05 LAB — MICROALBUMIN / CREATININE URINE RATIO
Creatinine, Urine: 118 mg/dL (ref 20–320)
MICROALB/CREAT RATIO: 6 ug/mg{creat} (ref <30)
Microalb, Ur: 0.7 mg/dL

## 2015-09-05 LAB — GLUCOSE, POCT (MANUAL RESULT ENTRY): POC GLUCOSE: 56 mg/dL — AB (ref 70–99)

## 2015-09-05 MED ORDER — BETHANECHOL CHLORIDE 10 MG PO TABS: 10.0000 mg | ORAL_TABLET | Freq: Four times a day (QID) | ORAL | Status: DC | PRN

## 2015-09-05 MED ORDER — GLUCAGON HCL (RDNA) 1 MG IJ SOLR
1.0000 mg | Freq: Once | INTRAMUSCULAR | Status: AC | PRN
Start: 2015-09-05 — End: ?

## 2015-09-05 NOTE — Assessment & Plan Note (Signed)
A: neurogenic bladder P: Bethanechol

## 2015-09-05 NOTE — Progress Notes (Signed)
Establish Care  F/U DM, taking medication as prescrybed Glucose running 30-To hi  No pain today Suicidal Thoughts but not plans  Stated overwhelm due to financial issues

## 2015-09-05 NOTE — Progress Notes (Signed)
LOGO@  Subjective:  Patient ID: [name], female    DOB: 1975/06/20  Age: 40 y.o. MRN: [id number]  CC: Establish Care and Diabetes   HPI Elizabeth GainerLori S [name] presents for    1. IDDM type 1:  Diagnosed at age 89. [place] [facility] [place] [place]. Previously on pump. Lost insurance. Has not had endocrinology since 2015. Has applied for orange card and [place].  CHRONIC DIABETES  Disease Monitoring  Blood Sugar Ranges: 39- up to too high to count, gets low sugars randomly. Feels that labile sugar is related to stress.   Polyuria: yes   Visual problems: yes, blurry vision when sugar is high. Tunnel vision and spots when sugar is low   Medication Compliance: yes  Medication Side Effects  Hypoglycemia: no   Preventitive Health Care  Eye Exam: done in June, 2016   Foot Exam: done today   Diet pattern: graze, does not eat big meals, toast and eggs in AM, mindful of carbs, sandwich or frozen dinner for lunch, hamburger or chicken and veggie with bread for dinner   Exercise: walks for exercise, walks drive way, checks mail and walks dogs   2. Trouble passing urine: for past year. Takes a long time to empty bladder and does not feel that bladder empties completely. Has urinary frequency at night that interferes with sleep. Sometimes she has to turn on the water or get in the tub to pass her urine.   3. Mood disorder: has reported hx of bipolar and borderline personality disorder. Chronic anxiety and depression. No under the care of mental health. Last saw psychiatrist in about 10 years. Her family doctor, Dr. Littie DeedsSteven Nolton, who she states is also her neighbor prescribes mental health and pain medications. She reports that he has been her family doctor for 30 years.   Past Medical History  Diagnosis Date  . Panic attacks   . Diabetes mellitus   . Suicidal thoughts   . Seizures (HCC)     diabetic seizures in past-last was few yrs ago  . Fibromyalgia   . DDD (degenerative disc disease),  lumbar   . Chronic back pain   . Chronic shoulder pain   . Borderline personality disorder   . Bipolar disorder [facility](HCC)     Past Surgical History  Procedure Laterality Date  . Cesarean section  1998    x1  . Hernia repair      bilateral inguinal hernia repairs age 89  . Laparoscopic tubal ligation  07/31/2011    Procedure: LAPAROSCOPIC TUBAL LIGATION;  Surgeon: Tilda BurrowJohn V Ferguson, MD;  Location: [facility];  Service: Gynecology;  Laterality: Bilateral;  laparoscopic bilateral tubal ligation with falope rings    Family History  Problem Relation Age of Onset  . Hypotension Neg Hx   . Anesthesia problems Neg Hx   . Malignant hyperthermia Neg Hx   . Pseudochol deficiency Neg Hx   . Heart disease    . Arthritis    . Cancer    . Diabetes    . Cancer Mother     cervical/female  . Cancer Father     throat  . Alcohol abuse Father     Social History  Substance Use Topics  . Smoking status: Current Every Day Smoker -- 0.50 packs/day for 20 years    Types: Cigarettes  . Smokeless tobacco: Never Used  . Alcohol Use: No     Comment: occasional    ROS Review of Systems  Constitutional: Negative  for fever and chills.  Eyes: Negative for visual disturbance.  Respiratory: Negative for shortness of breath.   Cardiovascular: Negative for chest pain.  Gastrointestinal: Negative for abdominal pain and blood in stool.  Genitourinary: Positive for difficulty urinating.  Musculoskeletal: Positive for myalgias, back pain and arthralgias.  Skin: Negative for rash.  Allergic/Immunologic: Negative for immunocompromised state.  Hematological: Negative for adenopathy. Does not bruise/bleed easily.  Psychiatric/Behavioral: Positive for dysphoric mood. Negative for suicidal ideas. The patient is nervous/anxious.     Objective:   Today's Vitals: BP 99/63 mmHg  Pulse 93  Temp(Src) 97.5 F (36.4 C) (Oral)  Resp 16  Ht  (1.575 m)  Wt 162 lb (73.483 kg)  BMI 29.62 kg/m2  SpO2 98%  LMP  09/02/2015  Physical Exam  Constitutional: She is oriented to person, place, and time. She appears well-developed and well-nourished. No distress.  HENT:  Head: Normocephalic and atraumatic.  Cardiovascular: Normal rate, regular rhythm, normal heart sounds and intact distal pulses.   Pulmonary/Chest: Effort normal and breath sounds normal.  Musculoskeletal: She exhibits no edema.  Neurological: She is alert and oriented to person, place, and time.  Skin: Skin is warm and dry. No rash noted.  Psychiatric: She has a normal mood and affect.    Lab Results  Component Value Date   HGBA1C 7.1* 08/05/2015   CBG 56  Assessment & Plan:   Problem List Items Addressed This Visit    Type 1 diabetes mellitus with complication (HCC) (Chronic)   Relevant Medications   glucagon (GLUCAGEN HYPOKIT) 1 MG SOLR injection   bethanechol (URECHOLINE) 10 MG tablet   Other Relevant Orders   Ambulatory referral to Endocrinology   Neurogenic bladder (Chronic)   Diabetic neuropathy (HCC) (Chronic)   Relevant Medications   glucagon (GLUCAGEN HYPOKIT) 1 MG SOLR injection    Other Visit Diagnoses    Hyperglycemia    -  Primary    Relevant Orders    POCT glucose (manual entry) (Completed)    Microalbumin/Creatinine Ratio, Urine    Screening for HIV (human immunodeficiency virus)        Relevant Orders    HIV antibody (with reflex)       Outpatient Encounter Prescriptions as of 09/05/2015  Medication Sig  . ALPRAZolam (XANAX) 1 MG tablet Take 1 mg by mouth 4 (four) times daily.  Marland Kitchen amphetamine-dextroamphetamine (ADDERALL) 20 MG tablet Take 20 mg by mouth 2 (two) times daily.  Marland Kitchen atorvastatin (LIPITOR) 20 MG tablet Take 1 tablet (20 mg total) by mouth daily.  . insulin aspart (NOVOLOG) 100 UNIT/ML injection Inject into the skin 3 (three) times daily before meals. Pt states she is using up to 10 times a day depending on what her sugar is.  . insulin glargine (LANTUS) 100 UNIT/ML injection Inject 40 Units  into the skin daily.   . Naproxen Sodium (ALEVE PO) Take by mouth daily as needed.  . Oxycodone HCl 10 MG TABS Take 10 mg by mouth 5 (five) times daily. Reported on 09/05/2015  . traZODone (DESYREL) 100 MG tablet Take 100-200 mg by mouth at bedtime. Reported on 08/09/2015  . bethanechol (URECHOLINE) 10 MG tablet Take 1 tablet (10 mg total) by mouth 4 (four) times daily as needed.  Marland Kitchen glucagon (GLUCAGEN HYPOKIT) 1 MG SOLR injection Inject 1 mg into the muscle once as needed for low blood sugar.   No facility-administered encounter medications on file as of 09/05/2015.    Follow-up: No Follow-up on file.  Boykin Nearing MD

## 2015-09-05 NOTE — Patient Instructions (Addendum)
Lawson FiscalLori was seen today for establish care and diabetes.  Diagnoses and all orders for this visit:  Hyperglycemia -     POCT glucose (manual entry) -     Microalbumin/Creatinine Ratio, Urine  Screening for HIV (human immunodeficiency virus) -     HIV antibody (with reflex)  Diabetic polyneuropathy associated with type 1 diabetes mellitus (HCC)  Neurogenic bladder  Type 1 diabetes mellitus with complication (HCC) -     Ambulatory referral to Endocrinology -     glucagon (GLUCAGEN HYPOKIT) 1 MG SOLR injection; Inject 1 mg into the muscle once as needed for low blood sugar. -     bethanechol (URECHOLINE) 10 MG tablet; Take 1 tablet (10 mg total) by mouth 4 (four) times daily as needed.  You have the option to follow up here with me or with Dr. Katherene PontoMcElroy. We are both primary care and you can choose where you prefer, however you cannot be seen at both clinics.   F/u in 4 weeks for neurogenic bladder/pap  if you choose to stick with me   Dr. Armen PickupFunches

## 2015-09-05 NOTE — Assessment & Plan Note (Signed)
A; IDDM type 1 with labile sugars, neuropathy and likely neurogenic bladder P: Endo referral Continue current insulin dose  Glucagon Rx

## 2015-09-06 ENCOUNTER — Encounter: Payer: Self-pay | Admitting: Clinical

## 2015-09-06 NOTE — Progress Notes (Signed)
Depression screen PHQ 2/9 09/05/2015  Decreased Interest 3  Down, Depressed, Hopeless 3  PHQ - 2 Score 6  Altered sleeping 3  Tired, decreased energy 3  Change in appetite 3  Feeling bad or failure about yourself  3  Trouble concentrating 2  Moving slowly or fidgety/restless 1  Suicidal thoughts 1  PHQ-9 Score 22  *No plans to end life in last 2 weeks  GAD 7 : Generalized Anxiety Score 09/05/2015  Nervous, Anxious, on Edge 3  Control/stop worrying 3  Worry too much - different things 3  Trouble relaxing 3  Restless 3  Easily annoyed or irritable 3  Afraid - awful might happen 3  Total GAD 7 Score 21

## 2015-09-07 ENCOUNTER — Telehealth: Payer: Self-pay | Admitting: *Deleted

## 2015-09-07 NOTE — Telephone Encounter (Signed)
LVM to return call.

## 2015-09-07 NOTE — Telephone Encounter (Signed)
-----   Message from Dessa PhiJosalyn Funches, MD sent at 09/06/2015  8:45 AM EDT ----- Screening HIV negative Normal urine microalbumin

## 2015-09-08 ENCOUNTER — Telehealth: Payer: Self-pay | Admitting: Family Medicine

## 2015-09-08 NOTE — Telephone Encounter (Signed)
Pt. Returned call. Pt. Also stated that she paid 50 dollars for medication that was giving to her to stop going to the restroom and it did not work for her. Please f/u with pt.

## 2015-09-08 NOTE — Telephone Encounter (Signed)
Patient said that she has been urinating frequently and was prescribed medicine to assist. Patient stated that the dosage is to take 4 tablets a day, however patient mentioned that she only took one and that she stopped taking the medicine because it kept making her use the restroom.  Patient would like advice on what to do or would like to be prescribed an alternative. Please follow up.

## 2015-09-09 NOTE — Telephone Encounter (Signed)
Pt. Returned call. Pt. Also stated that she paid 50 dollars for medication that was giving to her to stop going to the restroom and it did not work for her. Please f/u with pt. °

## 2015-09-12 ENCOUNTER — Ambulatory Visit: Payer: Self-pay | Admitting: Physician Assistant

## 2015-09-13 NOTE — Telephone Encounter (Signed)
LVM with Pt mom to return call

## 2015-09-21 ENCOUNTER — Ambulatory Visit: Payer: Self-pay | Admitting: Endocrinology

## 2015-10-11 ENCOUNTER — Ambulatory Visit: Payer: Self-pay | Admitting: Endocrinology

## 2015-11-02 ENCOUNTER — Telehealth: Payer: Self-pay | Admitting: Family Medicine

## 2015-11-02 ENCOUNTER — Encounter: Payer: Self-pay | Admitting: Endocrinology

## 2015-11-02 ENCOUNTER — Ambulatory Visit (INDEPENDENT_AMBULATORY_CARE_PROVIDER_SITE_OTHER): Payer: Self-pay | Admitting: Endocrinology

## 2015-11-02 VITALS — BP 136/80 | HR 101 | Temp 97.5°F | Ht 62.0 in | Wt 154.0 lb

## 2015-11-02 DIAGNOSIS — E108 Type 1 diabetes mellitus with unspecified complications: Secondary | ICD-10-CM

## 2015-11-02 LAB — POCT GLYCOSYLATED HEMOGLOBIN (HGB A1C): HEMOGLOBIN A1C: 7.7

## 2015-11-02 LAB — GLUCOSE, POCT (MANUAL RESULT ENTRY): POC Glucose: 177 mg/dl — AB (ref 70–99)

## 2015-11-02 MED ORDER — INSULIN NPH (HUMAN) (ISOPHANE) 100 UNIT/ML ~~LOC~~ SUSP: 30.0000 [IU] | Freq: Every day | SUBCUTANEOUS | Status: DC

## 2015-11-02 MED ORDER — INSULIN REGULAR HUMAN 100 UNIT/ML IJ SOLN: 15.0000 [IU] | Freq: Three times a day (TID) | INTRAMUSCULAR | Status: AC

## 2015-11-02 NOTE — Telephone Encounter (Signed)
Pt. Came into facility requesting for her PCP to change her insulin to insulin glargine (LANTUS) b/c she was prescribed  insulin NPH Human (HUMULIN N) 100 UNIT/ML injection and pt. Does not really want that insulin b/c she has never  Taken it. The Rx was sent to [facility] and pt. Can not afford it. Please f/u with pt.

## 2015-11-02 NOTE — Patient Instructions (Addendum)
good diet and exercise significantly improve the control of your diabetes.  please let me know if you wish to be referred to a dietician.  high blood sugar is very risky to your health.  you should see an eye doctor and dentist every year.  It is very important to get all recommended vaccinations.  controlling your blood pressure and cholesterol drastically reduces the damage diabetes does to your body.  Those who smoke should quit.  please discuss these with your doctor.  check your blood sugar 4 times a day: before the 3 meals, and at bedtime.  also check if you have symptoms of your blood sugar being too high or too low.  please keep a record of the readings and bring it to your next appointment here (or you can bring the meter itself).  You can write it on any piece of paper.  please call us sooner if your blood sugar goes below 70, or if you have a lot of readings over 200. Depression makes the care of diabetes very difficult, so it is very important to receive care for this.   For now: Please change the lantus to NPH, 30 units at bedtime, and: Change the novolog to regular, 15 units 3 times a day (just before each meal). Please come back for a follow-up appointment in 2-3 weeks.

## 2015-11-02 NOTE — Progress Notes (Signed)
Subjective:    Patient ID: [name], female    DOB: 1976/03/16, 40 y.o.   MRN: [id number]  HPI pt states DM was dx'ed in 1987; she has moderate neuropathy of the lower extremities, and assoc pain; she has been on insulin since dx; pt says her diet and exercise are poor; she has never had pancreatitis.  She says cbg's are extremely variable.  She has had TL.  She has had many episodes of severe hypoglycemia (most recently April of 2017).  She has had DKA several times per year, but seldom goes to the ER for this.  rx has been limited by lack of health insurance.  She takes multiple daily injections, but has trouble buying her insulin, so has been off lantus x 1 week.  She took pump rx x many years, but had to stop due to the cost of supplies.   Past Medical History  Diagnosis Date  . Panic attacks   . Diabetes mellitus   . Suicidal thoughts   . Seizures (HCC)     diabetic seizures in past-last was few yrs ago  . Fibromyalgia   . DDD (degenerative disc disease), lumbar   . Chronic back pain   . Chronic shoulder pain   . Borderline personality disorder   . Bipolar disorder [facility])     Past Surgical History  Procedure Laterality Date  . Cesarean section  1998    x1  . Hernia repair      bilateral inguinal hernia repairs age 89  . Laparoscopic tubal ligation  07/31/2011    Procedure: LAPAROSCOPIC TUBAL LIGATION;  Surgeon: [name], MD;  Location: [facility];  Service: Gynecology;  Laterality: Bilateral;  laparoscopic bilateral tubal ligation with falope rings    Social History   Social History  . Marital Status: Single    Spouse Name: N/A  . Number of Children: N/A  . Years of Education: N/A   Occupational History  . Not on file.   Social History Main Topics  . Smoking status: Current Every Day Smoker -- 0.50 packs/day for 20 years    Types: Cigarettes  . Smokeless tobacco: Never Used  . Alcohol Use: No     Comment: occasional  . Drug Use: No  . Sexual Activity: Yes      Birth Control/ Protection: Surgical   Other Topics Concern  . Not on file   Social History Narrative    Current Outpatient Prescriptions on File Prior to Visit  Medication Sig Dispense Refill  . ALPRAZolam (XANAX) 1 MG tablet Take 1 mg by mouth 4 (four) times daily.    Marland Kitchen amphetamine-dextroamphetamine (ADDERALL) 20 MG tablet Take 20 mg by mouth 2 (two) times daily.    Marland Kitchen atorvastatin (LIPITOR) 20 MG tablet Take 1 tablet (20 mg total) by mouth daily. 90 tablet 1  . glucagon (GLUCAGEN HYPOKIT) 1 MG SOLR injection Inject 1 mg into the muscle once as needed for low blood sugar. 1 each 3  . Naproxen Sodium (ALEVE PO) Take by mouth daily as needed.    . Oxycodone HCl 10 MG TABS Take 10 mg by mouth 5 (five) times daily. Reported on 09/05/2015     No current facility-administered medications on file prior to visit.    Allergies  Allergen Reactions  . Penicillins Rash    As a child  . Levofloxacin Swelling    Family History  Problem Relation Age of Onset  . Hypotension Neg Hx   .  Anesthesia problems Neg Hx   . Malignant hyperthermia Neg Hx   . Pseudochol deficiency Neg Hx   . Heart disease    . Arthritis    . Cancer    . Diabetes Neg Hx   . Cancer Mother     cervical/female  . Cancer Father     throat  . Alcohol abuse Father     BP 136/80 mmHg  Pulse 101  Temp(Src) 97.5 F (36.4 C) (Oral)  Ht 5\' 2"  (1.575 m)  Wt 154 lb (69.854 kg)  BMI 28.16 kg/m2  SpO2 94%  Review of Systems denies blurry vision, chest pain, sob, n/v, muscle cramps, and excessive diaphoresis.  She has chronic depression.  She has lost a few lbs.  She has chronic headache, cold intolerance, rhinorrhea, easy bruising, and nocturia.      Objective:   Physical Exam VS: see vs page GEN: no distress HEAD: head: no deformity eyes: no periorbital swelling, no proptosis external nose and ears are normal mouth: no lesion seen NECK: supple, thyroid is not enlarged CHEST WALL: no deformity LUNGS: clear  to auscultation CV: reg rate and rhythm, no murmur ABD: abdomen is soft, nontender.  no hepatosplenomegaly.  not distended.  no hernia MUSCULOSKELETAL: muscle bulk and strength are grossly normal.  no obvious joint swelling.  gait is normal and steady EXTEMITIES: no deformity.  no ulcer on the feet.  feet are of normal color and temp.  no edema PULSES: dorsalis pedis intact bilat.  no carotid bruit NEURO:  cn 2-12 grossly intact.   readily moves all 4's.  sensation is intact to touch on the feet.  SKIN:  Normal texture and temperature.  No rash or suspicious lesion is visible.   NODES:  None palpable at the neck PSYCH: alert, well-oriented.  Does not appear anxious nor depressed.    A1c=7.7%  i personally reviewed electrocardiogram tracing (01/26/14):  Indication: AMS Impression: LAE    Assessment & Plan:  DM: therapy limited by variable cbg's.  Severe hypoglycemia: this is limiting glycemic control. Depression: this also complicates the rx of DM.    Patient is advised the following: Patient Instructions  good diet and exercise significantly improve the control of your diabetes.  please let me know if you wish to be referred to a dietician.  high blood sugar is very risky to your health.  you should see an eye doctor and dentist every year.  It is very important to get all recommended vaccinations.  controlling your blood pressure and cholesterol drastically reduces the damage diabetes does to your body.  Those who smoke should quit.  please discuss these with your doctor.  check your blood sugar 4 times a day: before the 3 meals, and at bedtime.  also check if you have symptoms of your blood sugar being too high or too low.  please keep a record of the readings and bring it to your next appointment here (or you can bring the meter itself).  You can write it on any piece of paper.  please call us sooner if your blood sugar goes below 70, or if you have a lot of readings over  200. Depression makes the care of diabetes very difficult, so it is very important to receive care for this.   For now: Please change the lantus to NPH, 30 units at bedtime, and: Change the novolog to regular, 15 units 3 times a day (just before each meal). Please come back for a follow-up  appointment in 2-3 weeks.

## 2015-11-07 MED ORDER — INSULIN GLARGINE 100 UNIT/ML SOLOSTAR PEN: 25.0000 [IU] | PEN_INJECTOR | Freq: Every day | SUBCUTANEOUS | Status: AC

## 2015-11-07 MED ORDER — INSULIN GLARGINE 100 UNIT/ML SOLOSTAR PEN: 25.0000 [IU] | PEN_INJECTOR | Freq: Every day | SUBCUTANEOUS | Status: DC

## 2015-11-07 NOTE — Telephone Encounter (Signed)
Ok, i have sent a prescription to your pharmacy It will be just 25 units at bedtime to start, as we want to avoid lows

## 2015-11-07 NOTE — Telephone Encounter (Signed)
The lantus is ok to take, but it is more expensive Please let me know

## 2015-11-07 NOTE — Telephone Encounter (Signed)
Pt stated both of her low reading were in the evening.

## 2015-11-07 NOTE — Telephone Encounter (Signed)
Let's reduce the insulin.  What time of day was the low?

## 2015-11-07 NOTE — Telephone Encounter (Signed)
I contacted the pt. She stated this past week she had two low blood sugar readings ( she stated they both were at 92). Pt also stated during this time she had seizures. Pt was advised to contact her PCP about her seizures Pt voiced understanding and stated her PCP us out of town and will return June 1st. Pt wanted you to be aware of this.

## 2015-11-07 NOTE — Telephone Encounter (Signed)
I contacted the pt and she stated she would like to switch to the lantus because it works better for her.

## 2015-11-07 NOTE — Telephone Encounter (Signed)
Please reduce the supper reg insulin to 10 units

## 2015-11-07 NOTE — Telephone Encounter (Signed)
I contacted the pt back and she stated she reviewed her blood sugar chart and the two lows were not in the evening, but at 11 am on Friday and 3 am on Saturday morning. Pt was advise per Dr. George HughEllison's verbal order to start the lantus 25 units at bed time and to take 12 units of her meal time insulin. Pt was advised to call back on Thursday to report blood sugar readings and to call sooner if her blood sugar drops or spikes. Pt voiced understanding.

## 2015-11-07 NOTE — Telephone Encounter (Signed)
Humulin was prescribed by her Endocrinologist Request forwarded to patient's Endocrinologist

## 2015-11-23 ENCOUNTER — Ambulatory Visit: Payer: Self-pay | Admitting: Endocrinology

## 2015-12-19 ENCOUNTER — Ambulatory Visit: Payer: Self-pay | Admitting: Endocrinology

## 2015-12-19 DIAGNOSIS — Z0289 Encounter for other administrative examinations: Secondary | ICD-10-CM

## 2017-11-19 ENCOUNTER — Telehealth: Payer: Self-pay

## 2017-11-19 DIAGNOSIS — Z139 Encounter for screening, unspecified: Secondary | ICD-10-CM

## 2017-11-19 LAB — GLUCOSE, POCT (MANUAL RESULT ENTRY): POC Glucose: 279 mg/dl — AB (ref 70–99)

## 2017-11-19 NOTE — Congregational Nurse Program (Signed)
Congregational Nurse Program Note  Date of Encounter: 11/19/2017  Past Medical History: Past Medical History:  Diagnosis Date  . Bipolar disorder (HCC)   . Borderline personality disorder (HCC)   . Chronic back pain   . Chronic shoulder pain   . DDD (degenerative disc disease), lumbar   . Diabetes mellitus   . Fibromyalgia   . Panic attacks   . Seizures (HCC)    diabetic seizures in past-last was few yrs ago  . Suicidal thoughts     Encounter Details: CNP Questionnaire - 11/19/17 1456      Questionnaire   Referrals  Not Applicable attempted other referrals   attempted other referrals     Client in today referred to [facility] Bowerl[facility] Gunn for screening. [facility] and is wanting [facility]. She is uninsured and lives alone. She states her mother helps with her bills. She is not employed at this time. She states she has been seeing Dr. Sudie BaileyKnowlton every 3 months and he has been prescribing her oxycodone  10 mg 1 tablet by mouth four times a day, xanax 1 mg 1 tablet by mouth three times a day and adderoll 20 mg 1 tablet twice daily. Client states she is an insulin dependent diabetic and has been on regular insulin sliding scale as well as NPH 20 units am and 20 units PM and at one time was on Lantus. She states Dr. Sudie BaileyKnowlton will not give her samples and she cannot afford the insulins, and he will not help with any medications assistance programs through his office. She does report that he does prescribe her the other medications above and that she goes to see him every 3 months. She states she just wants insulin from the free clinic. Client reports she will continue to see Dr. Sudie BaileyKnowlton for her oxycodone, xanax and adderall. RN discussed that unsure if the [facility] will see her if she is also seeing another primary care provider, but RN would call [facility] to verify. RN discussed that it is not recommended to have two primary care providers. Client understands that  the [facility] or other "safety net" providers will not prescribe any controlled substances. Client states she understands, but states she went to the [facility] and Dr Sudie BaileyKnowlton both in the past.  [facility] called [facility] in the room with client and [facility] states they cannot see her if she also sees Dr. Sudie BaileyKnowlton. Client allowed to speak on the phone to the [facility]. They advised her the same. RN attempted to discuss other options and calling Dr. Michelle NasutiKnowlton's office to inquire about possible asistance with insulins and client became very agitated and confrontational.  RN attempted to deescalate the situation by again offering to discuss other possible options. Client yelled and left [facility].   RN will attempt to call client back later to give other possible resources for insulin assistance while still staying with Dr. Sudie BaileyKnowlton per client's desire.

## 2017-11-19 NOTE — Telephone Encounter (Signed)
Called client to offer other resources for assistance with insulins. No answer, left voicemail for client to return call to clara gunn.   Client previously seen today. Sees Dr. Sudie BaileyKnowlton every 3 months per client however, also wants to be [facility] and assistance with insulins. [facility] verbalized to client that she could not have two primary care providers. Client left [facility] before Nursing could offer other alternatives for medication assistance such as Community prescription assistance program.

## 2018-01-15 ENCOUNTER — Ambulatory Visit: Payer: Self-pay | Admitting: "Endocrinology

## 2018-07-08 ENCOUNTER — Other Ambulatory Visit: Payer: Self-pay

## 2018-07-08 ENCOUNTER — Emergency Department (HOSPITAL_COMMUNITY)
Admission: EM | Admit: 2018-07-08 | Discharge: 2018-07-08 | Disposition: A | Discharge status: Home / Self Care | Payer: Self-pay | Attending: Emergency Medicine | Admitting: Emergency Medicine

## 2018-07-08 ENCOUNTER — Encounter (HOSPITAL_COMMUNITY): Payer: Self-pay | Admitting: Emergency Medicine

## 2018-07-08 DIAGNOSIS — E860 Dehydration: Secondary | ICD-10-CM

## 2018-07-08 DIAGNOSIS — Z87891 Personal history of nicotine dependence: Secondary | ICD-10-CM | POA: Insufficient documentation

## 2018-07-08 DIAGNOSIS — F319 Bipolar disorder, unspecified: Secondary | ICD-10-CM | POA: Insufficient documentation

## 2018-07-08 DIAGNOSIS — Z794 Long term (current) use of insulin: Secondary | ICD-10-CM | POA: Insufficient documentation

## 2018-07-08 DIAGNOSIS — E1065 Type 1 diabetes mellitus with hyperglycemia: Secondary | ICD-10-CM

## 2018-07-08 DIAGNOSIS — E785 Hyperlipidemia, unspecified: Secondary | ICD-10-CM | POA: Insufficient documentation

## 2018-07-08 LAB — URINALYSIS, ROUTINE W REFLEX MICROSCOPIC
Bilirubin Urine: NEGATIVE
Glucose, UA: >=500 mg/dL — AB
HGB URINE DIPSTICK: NEGATIVE
KETONES UR: 80 mg/dL — AB
LEUKOCYTES UA: NEGATIVE
Nitrite: NEGATIVE
PH: 5 (ref 5.0–8.0)
PROTEIN: NEGATIVE mg/dL
Specific Gravity, Urine: 1.029 (ref 1.005–1.030)

## 2018-07-08 LAB — BASIC METABOLIC PANEL
Anion gap: 14 (ref 5–15)
BUN: 24 mg/dL — AB (ref 6–20)
CALCIUM: 9 mg/dL (ref 8.9–10.3)
CHLORIDE: 97 mmol/L — AB (ref 98–111)
CO2: 20 mmol/L — AB (ref 22–32)
CREATININE: 1.08 mg/dL — AB (ref 0.44–1.00)
GFR calc Af Amer: >60 mL/min (ref >60)
GFR calc non Af Amer: >60 mL/min (ref >60)
Glucose, Bld: 375 mg/dL — ABNORMAL HIGH (ref 70–99)
Potassium: 4.4 mmol/L (ref 3.5–5.1)
Sodium: 131 mmol/L — ABNORMAL LOW (ref 135–145)

## 2018-07-08 LAB — CBG MONITORING, ED
Glucose-Capillary: 392 mg/dL — ABNORMAL HIGH (ref 70–99)
Glucose-Capillary: 288 mg/dL — ABNORMAL HIGH (ref 70–99)

## 2018-07-08 LAB — BLOOD GAS, VENOUS
Acid-base deficit: 1.6 mmol/L (ref 0.0–2.0)
Bicarbonate: 22.6 mmol/L (ref 20.0–28.0)
FIO2: 0.21
O2 Saturation: 72 %
Patient temperature: 36.6
pCO2, Ven: 38 mmHg — ABNORMAL LOW (ref 44.0–60.0)
pH, Ven: 7.392 (ref 7.250–7.430)
pO2, Ven: 39.9 mmHg (ref 32.0–45.0)

## 2018-07-08 LAB — CBC
HEMATOCRIT: 37.6 % (ref 36.0–46.0)
Hemoglobin: 12.2 g/dL (ref 12.0–15.0)
MCH: 31.2 pg (ref 26.0–34.0)
MCHC: 32.4 g/dL (ref 30.0–36.0)
MCV: 96.2 fL (ref 80.0–100.0)
PLATELETS: 353 10*3/uL (ref 150–400)
RBC: 3.91 MIL/uL (ref 3.87–5.11)
RDW: 12.6 % (ref 11.5–15.5)
WBC: 16.8 10*3/uL — ABNORMAL HIGH (ref 4.0–10.5)
nRBC: 0 % (ref 0.0–0.2)

## 2018-07-08 LAB — HCG, SERUM, QUALITATIVE: Preg, Serum: NEGATIVE

## 2018-07-08 MED ORDER — ONDANSETRON HCL 4 MG/2ML IJ SOLN
4.0000 mg | Freq: Once | INTRAMUSCULAR | Status: AC
  Administered 2018-07-08: 4 mg via INTRAVENOUS
  Filled 2018-07-08: qty 2

## 2018-07-08 MED ORDER — SODIUM CHLORIDE 0.9 % IV BOLUS
1000.0000 mL | Freq: Once | INTRAVENOUS | Status: AC
  Administered 2018-07-08: 1000 mL via INTRAVENOUS

## 2018-07-08 MED ORDER — SODIUM CHLORIDE 0.9 % IV SOLN: INTRAVENOUS | Status: DC

## 2018-07-08 MED ORDER — PROMETHAZINE HCL 25 MG PO TABS: 25.0000 mg | ORAL_TABLET | Freq: Four times a day (QID) | ORAL | 0 refills | Status: AC | PRN

## 2018-07-08 NOTE — ED Notes (Signed)
cbg [phone] on arrival

## 2018-07-08 NOTE — ED Notes (Signed)
ED Provider at bedside. 

## 2018-07-08 NOTE — ED Provider Notes (Signed)
[facility] EMERGENCY DEPARTMENT Provider Note   CSN: [id number] Arrival date & time: 07/08/18  1144     History   Chief Complaint Chief Complaint  Patient presents with  . Hyperglycemia    HPI [name] is a 43 y.o. female.  Pt presents to the ED today with elevated blood sugars.  She is a type 1 diabetic and was "stranded without her insulin for 6 hrs."  She said she was unable to take her evening dose of insulin.  When she was finally able to get to her home, blood sugar read "hi" and she started vomiting.  Blood sugar remained hi, so she called EMS.  She did give herself a dose of insulin prior to EMS arriving.  Pt is still nauseous, but not as bad as she was earlier.       Past Medical History:  Diagnosis Date  . Bipolar disorder (HCC)   . Borderline personality disorder (HCC)   . Chronic back pain   . Chronic shoulder pain   . DDD (degenerative disc disease), lumbar   . Diabetes mellitus   . Fibromyalgia   . Panic attacks   . Seizures (HCC)    diabetic seizures in past-last was few yrs ago  . Suicidal thoughts     Patient Active Problem List   Diagnosis Date Noted  . Diabetic neuropathy (HCC) 09/05/2015  . Neurogenic bladder 09/05/2015  . Depression 08/09/2015  . Personal history of noncompliance with medical treatment, presenting hazards to health 07/20/2015  . Hyperlipidemia 05/15/2015  . Cigarette nicotine dependence, uncomplicated 05/15/2015  . Type 1 diabetes mellitus with complication (HCC) 05/15/2015  . Acromioclavicular (AC) joint injury 11/27/2011  . Dislocation of acromioclavicular joint with 100%-200% displacement 11/27/2011  . Sterilization -Falope Rings 07/31/2011  . Diabetes (HCC) 07/31/2011    Past Surgical History:  Procedure Laterality Date  . CESAREAN SECTION  1998   x1  . HERNIA REPAIR     bilateral inguinal hernia repairs age 52  . LAPAROSCOPIC TUBAL LIGATION  07/31/2011   Procedure: LAPAROSCOPIC TUBAL LIGATION;  Surgeon: [name], MD;  Location: [facility];  Service: Gynecology;  Laterality: Bilateral;  laparoscopic bilateral tubal ligation with falope rings     OB History   No obstetric history on file.      Home Medications    Prior to Admission medications   Medication Sig Start Date End Date Taking? Authorizing Provider  ALPRAZolam Prudy Feeler) 1 MG tablet Take 1 mg by mouth 4 (four) times daily.    [provider]  amphetamine-dextroamphetamine (ADDERALL) 20 MG tablet Take 20 mg by mouth 2 (two) times daily.    [provider]  atorvastatin (LIPITOR) 20 MG tablet Take 1 tablet (20 mg total) by mouth daily. 08/09/15   Jacquelin Hawking, PA-C  glucagon (GLUCAGEN HYPOKIT) 1 MG SOLR injection Inject 1 mg into the muscle once as needed for low blood sugar. 09/05/15   Funches, Gerilyn Nestle, MD  Insulin Glargine (LANTUS SOLOSTAR) 100 UNIT/ML Solostar Pen Inject 25 Units into the skin at bedtime. 11/07/15   Romero Belling, MD  insulin regular (HUMULIN R) 100 units/mL injection Inject 0.15 mLs (15 Units total) into the skin 3 (three) times daily before meals. And syringes 4/day 11/02/15   Romero Belling, MD  Naproxen Sodium (ALEVE PO) Take by mouth daily as needed.    [provider]  Oxycodone HCl 10 MG TABS Take 10 mg by mouth 5 (five) times daily. Reported on 09/05/2015  [provider]  promethazine (PHENERGAN) 25 MG tablet Take 1 tablet (25 mg total) by mouth every 6 (six) hours as needed for nausea or vomiting. 07/08/18   Jacalyn Lefevre, MD  QUEtiapine (SEROQUEL) 50 MG tablet Take 100 mg by mouth at bedtime.    [provider]    Family History Family History  Problem Relation Age of Onset  . Cancer Mother        cervical/female  . Cancer Father        throat  . Alcohol abuse Father   . Heart disease Other   . Arthritis Other   . Cancer Other   . Hypotension Neg Hx   . Anesthesia problems Neg Hx   . Malignant hyperthermia Neg Hx   . Pseudochol deficiency Neg Hx   .  Diabetes Neg Hx     Social History Social History   Tobacco Use  . Smoking status: Former Smoker    Packs/day: 0.50    Years: 20.00    Pack years: 10.00    Types: Cigarettes    Last attempt to quit: 07/01/2018    Years since quitting: 0.0  . Smokeless tobacco: Never Used  Substance Use Topics  . Alcohol use: No    Comment: occasional  . Drug use: No     Allergies   Penicillins and Levofloxacin   Review of Systems Review of Systems  Gastrointestinal: Positive for nausea and vomiting.  Endocrine:       Elevated blood sugar  All other systems reviewed and are negative.    Physical Exam Updated Vital Signs BP 125/90   Pulse (!) 114   Temp 97.8 F (36.6 C) (Oral) Comment: Simultaneous filing. User may not have seen previous data. Comment (Src): Simultaneous filing. User may not have seen previous data.  Resp 14   Ht 5\' 3"  (1.6 m)   Wt 79.4 kg   LMP 06/07/2018   SpO2 97%   BMI 31.00 kg/m   Physical Exam Vitals signs and nursing note reviewed.  Constitutional:      Appearance: Normal appearance.  HENT:     Head: Normocephalic and atraumatic.     Right Ear: External ear normal.     Left Ear: External ear normal.     Nose: Nose normal.     Mouth/Throat:     Mouth: Mucous membranes are dry.  Eyes:     Extraocular Movements: Extraocular movements intact.     Conjunctiva/sclera: Conjunctivae normal.     Pupils: Pupils are equal, round, and reactive to light.  Neck:     Musculoskeletal: Normal range of motion and neck supple.  Cardiovascular:     Rate and Rhythm: Regular rhythm. Tachycardia present.  Pulmonary:     Effort: Pulmonary effort is normal.     Breath sounds: Normal breath sounds.  Abdominal:     General: Abdomen is flat.     Palpations: Abdomen is soft.  Musculoskeletal: Normal range of motion.  Skin:    General: Skin is warm and dry.     Capillary Refill: Capillary refill takes less than 2 seconds.  Neurological:     General: No focal  deficit present.     Mental Status: She is alert and oriented to person, place, and time.  Psychiatric:        Mood and Affect: Mood normal.        Behavior: Behavior normal.      ED Treatments / Results  Labs (all labs ordered  are listed, but only abnormal results are displayed) Labs Reviewed  BASIC METABOLIC PANEL - Abnormal; Notable for the following components:      Result Value   Sodium 131 (*)    Chloride 97 (*)    CO2 20 (*)    Glucose, Bld 375 (*)    BUN 24 (*)    Creatinine, Ser 1.08 (*)    All other components within normal limits  CBC - Abnormal; Notable for the following components:   WBC 16.8 (*)    All other components within normal limits  URINALYSIS, ROUTINE W REFLEX MICROSCOPIC - Abnormal; Notable for the following components:   Glucose, UA >=500 (*)    Ketones, ur 80 (*)    Bacteria, UA RARE (*)    All other components within normal limits  BLOOD GAS, VENOUS - Abnormal; Notable for the following components:   pCO2, Ven 38.0 (*)    All other components within normal limits  CBG MONITORING, ED - Abnormal; Notable for the following components:   Glucose-Capillary 392 (*)    All other components within normal limits  CBG MONITORING, ED - Abnormal; Notable for the following components:   Glucose-Capillary 288 (*)    All other components within normal limits  HCG, SERUM, QUALITATIVE    EKG EKG Interpretation  Date/Time:  Tuesday July 08 2018 11:59:32 EST Ventricular Rate:  112 PR Interval:    QRS Duration: 96 QT Interval:  313 QTC Calculation: 428 R Axis:   37 Text Interpretation:  Sinus tachycardia Biatrial enlargement S1,S2,S3 pattern RSR' in V1 or V2, right VCD or RVH Baseline wander in lead(s) II aVF V3 V5 V6 Since last tracing rate faster Confirmed by Jacalyn LefevreHaviland, Geraline Halberstadt [phone] on 07/08/2018 12:47:48 PM   Radiology No results found.  Procedures Procedures (including critical care time)  Medications Ordered in ED Medications  sodium  chloride 0.9 % bolus 1,000 mL (1,000 mLs Intravenous New Bag/Given 07/08/18 1236)    And  0.9 %  sodium chloride infusion (has no administration in time range)  ondansetron (ZOFRAN) injection 4 mg (4 mg Intravenous Given 07/08/18 1255)     Initial Impression / Assessment and Plan / ED Course  I have reviewed the triage vital signs and the nursing notes.  Pertinent labs & imaging results that were available during my care of the patient were reviewed by me and considered in my medical decision making (see chart for details).    Pt is not in DKA.  BS is trending downward.  She is feeling much better after IVFs.  She is instructed to return if worse and to f/u with PCP.  Final Clinical Impressions(s) / ED Diagnoses   Final diagnoses:  Dehydration  Type 1 diabetes mellitus with hyperglycemia [facility](HCC)    ED Discharge Orders         Ordered    promethazine (PHENERGAN) 25 MG tablet  Every 6 hours PRN     07/08/18 1358           Jacalyn LefevreHaviland, Arin Vanosdol, MD 07/08/18 1359

## 2018-07-08 NOTE — ED Triage Notes (Signed)
Pt is type 1 diabetic and got "stranded without her insulin for approx 6 hours. Pt reports she got a ride home and was able to take short and long term insulin. Per EMS pt CBG 463.

## 2018-09-17 DEATH — deceased
